# Patient Record
Sex: Female | Born: 1965 | Race: Black or African American | Hispanic: No | Smoking: Never smoker
Health system: Southern US, Community
[De-identification: ages and names within clinical notes are randomized; demographics above are authoritative.]

## PROBLEM LIST (undated history)

## (undated) DIAGNOSIS — G43909 Migraine, unspecified, not intractable, without status migrainosus: Secondary | ICD-10-CM

## (undated) DIAGNOSIS — C9590 Leukemia, unspecified not having achieved remission: Secondary | ICD-10-CM

## (undated) HISTORY — PX: ABDOMINAL HYSTERECTOMY: SHX81

---

## 2006-01-02 ENCOUNTER — Emergency Department (HOSPITAL_COMMUNITY): Admission: EM | Admit: 2006-01-02 | Discharge: 2006-01-02 | Payer: Self-pay | Admitting: Emergency Medicine

## 2007-04-03 ENCOUNTER — Emergency Department (HOSPITAL_COMMUNITY): Admission: EM | Admit: 2007-04-03 | Discharge: 2007-04-03 | Payer: Self-pay | Admitting: Emergency Medicine

## 2008-02-04 ENCOUNTER — Emergency Department (HOSPITAL_COMMUNITY): Admission: EM | Admit: 2008-02-04 | Discharge: 2008-02-04 | Payer: Self-pay | Admitting: Family Medicine

## 2008-04-20 ENCOUNTER — Emergency Department (HOSPITAL_COMMUNITY): Admission: EM | Admit: 2008-04-20 | Discharge: 2008-04-20 | Payer: Self-pay | Admitting: Emergency Medicine

## 2010-01-09 IMAGING — CR DG SHOULDER 2+V*R*
4 series · 4 of 4 positions shown · non-contrast
Comparison: None

CLINICAL DATA: Shoulder pain, no acute injury

RIGHT SHOULDER - 2+ VIEW

[view not recorded (1 of 4)]
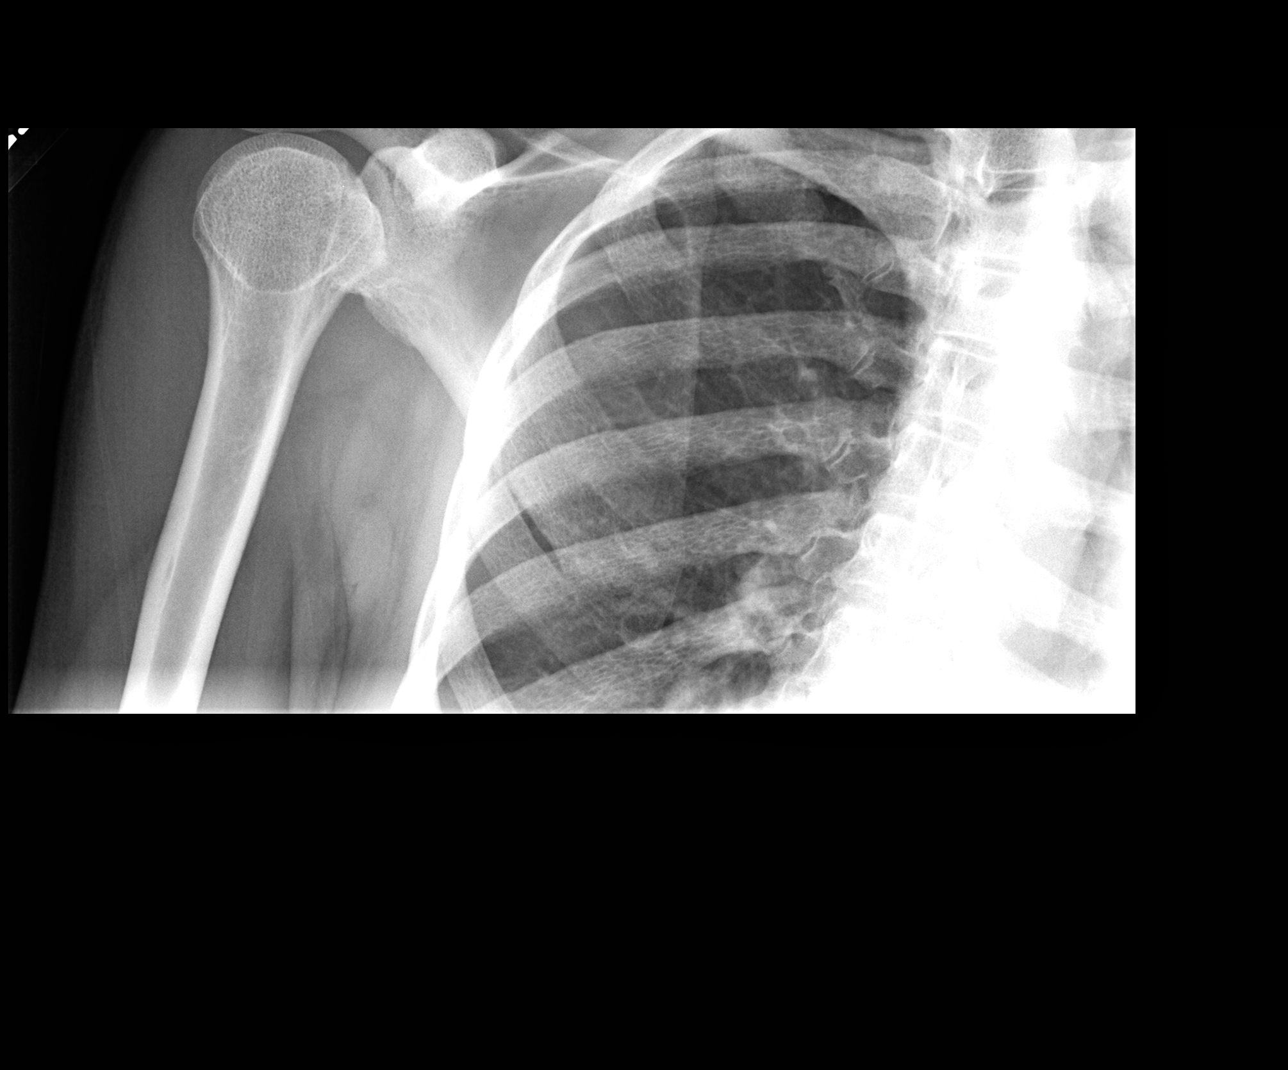

[view not recorded (2 of 4)]
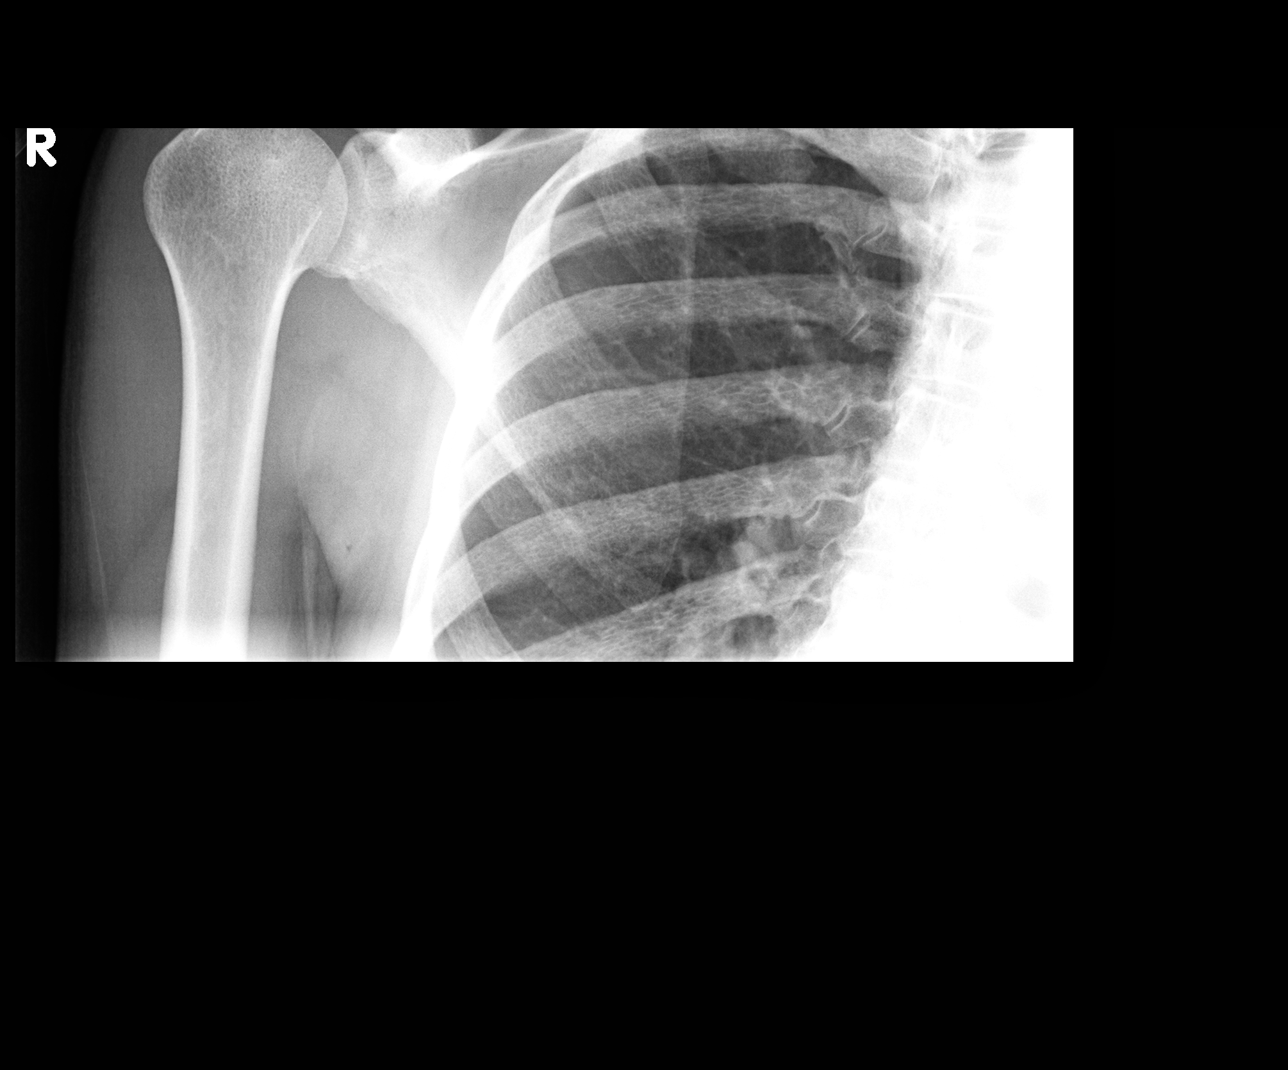

[view not recorded (3 of 4)]
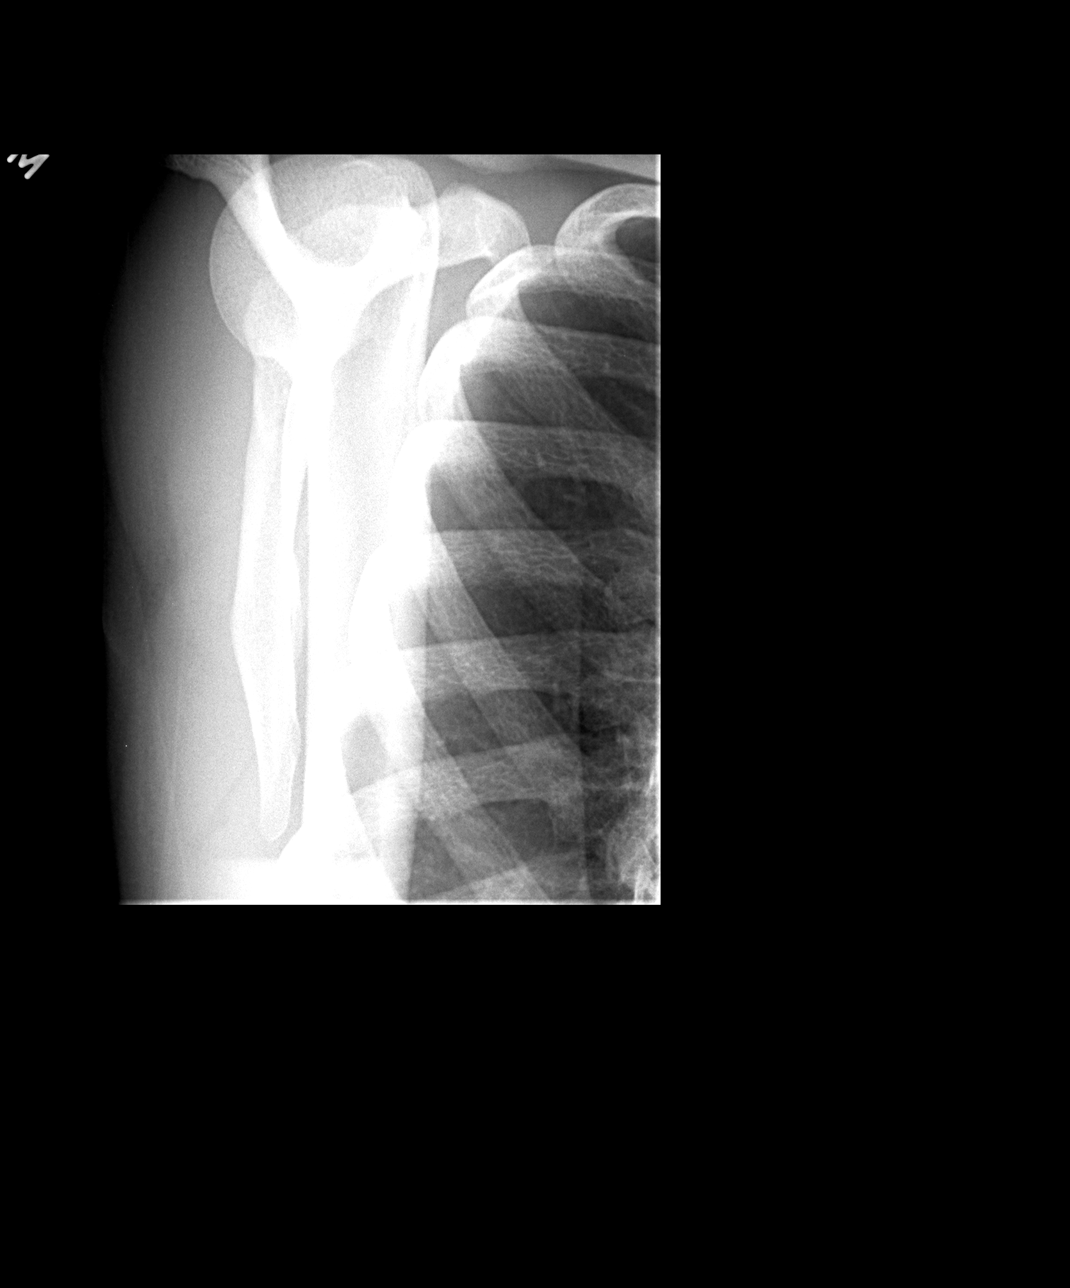

[view not recorded (4 of 4)]
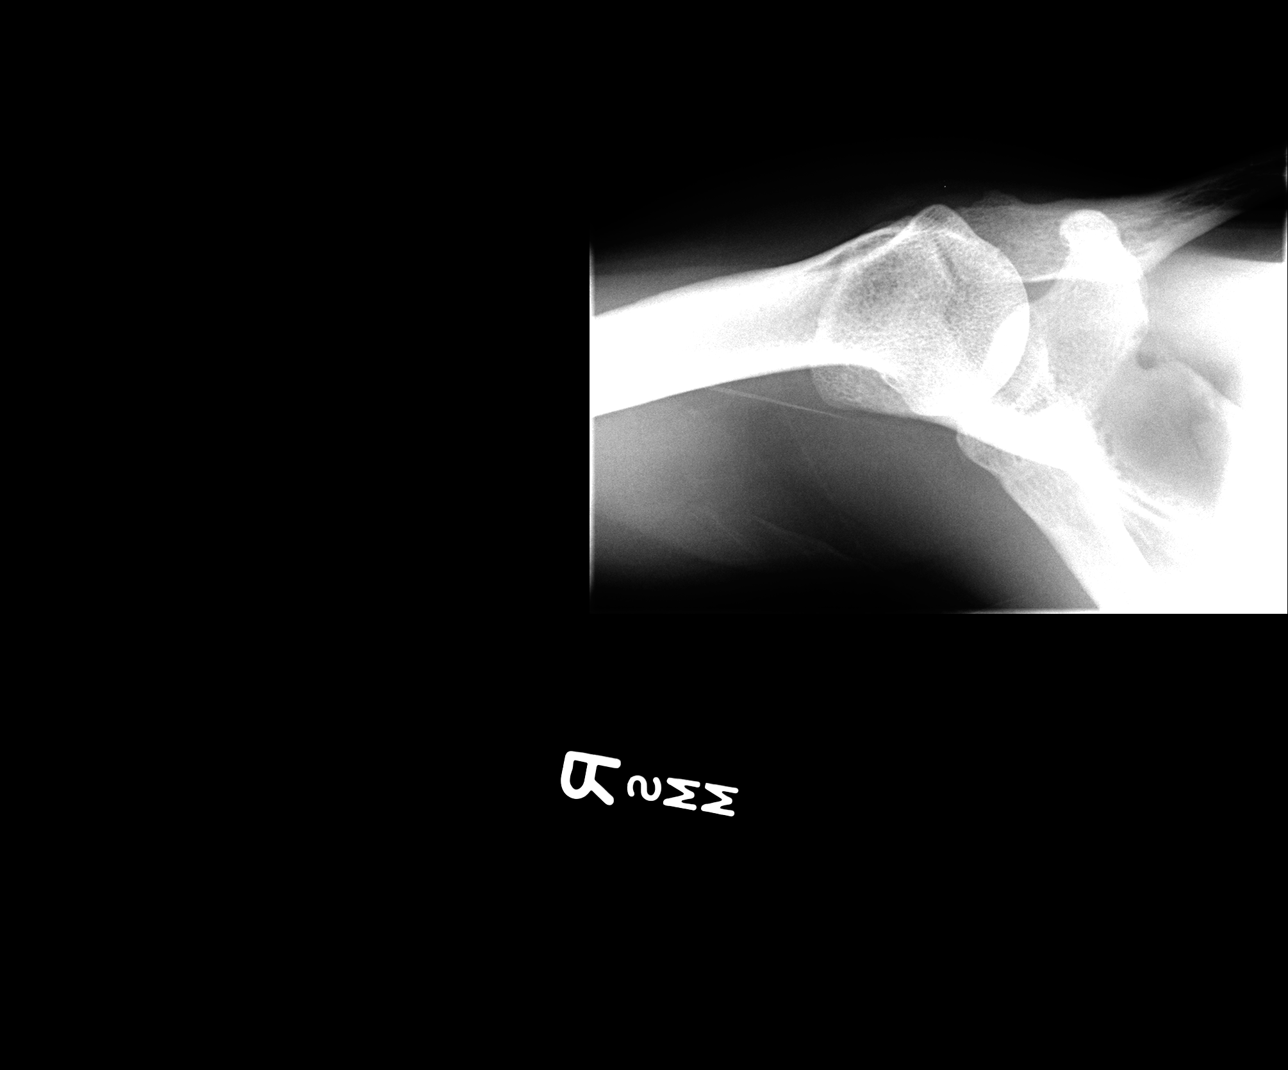

[4 of 4 positions shown; findings below may reference images not displayed]

FINDINGS: The glenohumeral joint space appears normal.  The AC
joint is normally aligned.  No acute bony abnormality is seen.
IMPRESSION: Negative right shoulder.

## 2010-03-26 IMAGING — CR DG ABDOMEN 1V
1 series · 1 of 1 positions shown · non-contrast
Comparison: None

CLINICAL DATA: Hematuria

ABDOMEN - 1 VIEW

[t abdomen supine]
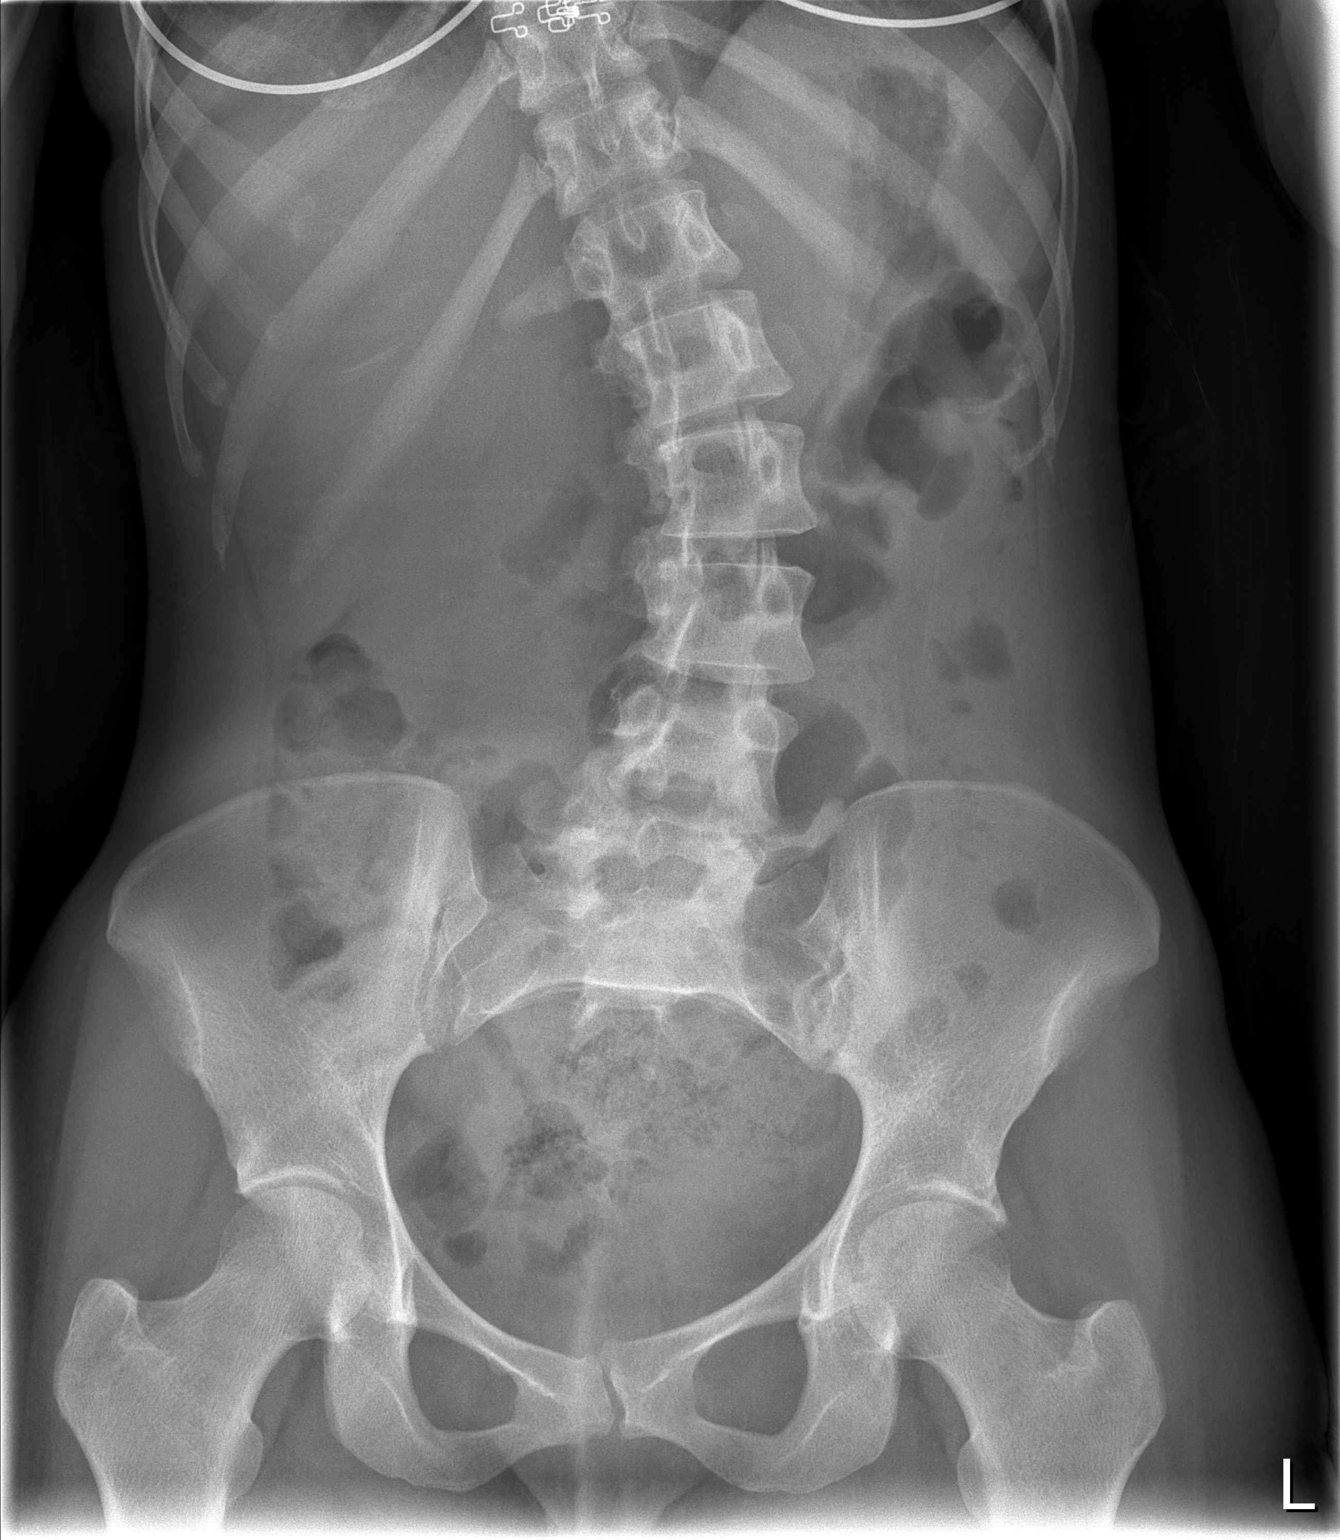

[1 of 1 positions shown; findings below may reference images not displayed]

FINDINGS: The bowel gas pattern is unremarkable.  There is a
significant left convex rotatory scoliotic curvature lumbar spine.
The soft tissue shadows of the abdomen are maintained.  No
worrisome calcifications are seen.
IMPRESSION: No plain film evidence of acute abdominal process.

## 2010-06-08 LAB — DIFFERENTIAL
Basophils Absolute: 0 10*3/uL (ref 0.0–0.1)
Basophils Relative: 0 % (ref 0–1)
Neutro Abs: 12.9 10*3/uL — ABNORMAL HIGH (ref 1.7–7.7)
Neutrophils Relative %: 84 % — ABNORMAL HIGH (ref 43–77)

## 2010-06-08 LAB — URINALYSIS, ROUTINE W REFLEX MICROSCOPIC
Glucose, UA: NEGATIVE mg/dL
Ketones, ur: 40 mg/dL — AB
pH: 6 (ref 5.0–8.0)

## 2010-06-08 LAB — URINE MICROSCOPIC-ADD ON

## 2010-06-08 LAB — POCT I-STAT, CHEM 8
Chloride: 101 mEq/L (ref 96–112)
HCT: 38 % (ref 36.0–46.0)
Hemoglobin: 12.9 g/dL (ref 12.0–15.0)
Potassium: 3.2 mEq/L — ABNORMAL LOW (ref 3.5–5.1)

## 2010-06-08 LAB — CBC
MCHC: 32.9 g/dL (ref 30.0–36.0)
Platelets: 268 10*3/uL (ref 150–400)
RBC: 4.21 MIL/uL (ref 3.87–5.11)
RDW: 15.2 % (ref 11.5–15.5)

## 2012-11-02 ENCOUNTER — Emergency Department (HOSPITAL_COMMUNITY)
Admission: EM | Admit: 2012-11-02 | Discharge: 2012-11-02 | Discharge status: Against Medical Advice | Payer: Self-pay | Attending: Emergency Medicine | Admitting: Emergency Medicine

## 2012-11-02 ENCOUNTER — Encounter (HOSPITAL_COMMUNITY): Payer: Self-pay | Admitting: Emergency Medicine

## 2012-11-02 DIAGNOSIS — G43909 Migraine, unspecified, not intractable, without status migrainosus: Secondary | ICD-10-CM | POA: Insufficient documentation

## 2012-11-02 HISTORY — DX: Migraine, unspecified, not intractable, without status migrainosus: G43.909

## 2012-11-02 NOTE — ED Notes (Signed)
Patient with headache since this morning.  She does have nausea and vomiting.  Patient is photophobic.  Patient took one goodie powder this morning with no relief.

## 2015-01-09 ENCOUNTER — Emergency Department (HOSPITAL_COMMUNITY)
Admission: EM | Admit: 2015-01-09 | Discharge: 2015-01-09 | Disposition: A | Discharge status: Home / Self Care | Payer: Self-pay | Attending: Emergency Medicine | Admitting: Emergency Medicine

## 2015-01-09 ENCOUNTER — Encounter (HOSPITAL_COMMUNITY): Payer: Self-pay | Admitting: *Deleted

## 2015-01-09 DIAGNOSIS — Z8679 Personal history of other diseases of the circulatory system: Secondary | ICD-10-CM | POA: Insufficient documentation

## 2015-01-09 DIAGNOSIS — R519 Headache, unspecified: Secondary | ICD-10-CM

## 2015-01-09 DIAGNOSIS — Z7982 Long term (current) use of aspirin: Secondary | ICD-10-CM | POA: Insufficient documentation

## 2015-01-09 DIAGNOSIS — R51 Headache: Secondary | ICD-10-CM | POA: Insufficient documentation

## 2015-01-09 DIAGNOSIS — H53149 Visual discomfort, unspecified: Secondary | ICD-10-CM | POA: Insufficient documentation

## 2015-01-09 MED ORDER — KETOROLAC TROMETHAMINE 30 MG/ML IJ SOLN
30.0000 mg | Freq: Once | INTRAMUSCULAR | Status: AC
  Administered 2015-01-09: 30 mg via INTRAMUSCULAR
  Filled 2015-01-09: qty 1

## 2015-01-09 MED ORDER — DIPHENHYDRAMINE HCL 50 MG/ML IJ SOLN
25.0000 mg | Freq: Once | INTRAMUSCULAR | Status: AC
  Administered 2015-01-09: 25 mg via INTRAMUSCULAR
  Filled 2015-01-09: qty 1

## 2015-01-09 MED ORDER — METOCLOPRAMIDE HCL 5 MG/ML IJ SOLN
10.0000 mg | Freq: Once | INTRAMUSCULAR | Status: AC
Start: 2015-01-09 — End: 2015-01-09
  Administered 2015-01-09: 10 mg via INTRAMUSCULAR
  Filled 2015-01-09: qty 2

## 2015-01-09 NOTE — ED Notes (Signed)
Pt with hx of migraines to ED c/o frontal migraine since 8 am.  Pt photophobic but denies nausea. Pt states no relief with exedrine.

## 2015-01-09 NOTE — ED Provider Notes (Signed)
CSN: [id number]     Arrival date & time 01/09/15  1850 History  By signing my name below, I, Randa Evens, attest that this documentation has been prepared under the direction and in the presence of Glendell Docker, NP. Electronically Signed: Randa Evens, ED Scribe. 01/09/2015. 7:55 PM.      Chief Complaint  Patient presents with  . Migraine    Patient is a 49 y.o. female presenting with migraines. The history is provided by the patient. No language interpreter was used.  Migraine Associated symptoms include headaches.   HPI Comments: Morgan Waters is a 49 y.o. female with PMHx of migraines who presents to the Emergency Department complaining of frontal migraine HA onset 1 night prior. Pt reports associated photophobia. Pt states that she has tried Excedrin with no relief. Pt states that the her symptoms are worse with bright lights.  Pt denies fever, nausea vomiting, changes in vision, numbness or weakness. Similar to previous headache.  Past Medical History  Diagnosis Date  . Migraine    Past Surgical History  Procedure Laterality Date  . Cesarean section    . Abdominal hysterectomy     No family history on file. Social History  Substance Use Topics  . Smoking status: Never Smoker   . Smokeless tobacco: None  . Alcohol Use: Yes     Comment: occasionally   OB History    No data available     Review of Systems  Constitutional: Negative for fever.  Eyes: Positive for photophobia. Negative for visual disturbance.  Gastrointestinal: Negative for nausea and vomiting.  Neurological: Positive for headaches. Negative for weakness and numbness.  All other systems reviewed and are negative.     Allergies  Review of patient's allergies indicates no known allergies.  Home Medications   Prior to Admission medications   Medication Sig Start Date End Date Taking? Authorizing Provider  Aspirin-Salicylamide-Caffeine (BC HEADACHE POWDER PO) Take 1 packet by mouth  daily as needed. For pain/migraines    Historical Provider, MD   BP 133/95 mmHg  Pulse 68  Temp(Src) 97.8 F (36.6 C) (Oral)  Resp 14  SpO2 100%   Physical Exam  Constitutional: She is oriented to person, place, and time. She appears well-developed and well-nourished. No distress.  HENT:  Head: Normocephalic and atraumatic.  Right Ear: External ear normal.  Left Ear: External ear normal.  Eyes: Conjunctivae and EOM are normal. Pupils are equal, round, and reactive to light.  Neck: Neck supple. No tracheal deviation present.  Cardiovascular: Normal rate.   Pulmonary/Chest: Effort normal. No respiratory distress.  Musculoskeletal: Normal range of motion.  Neurological: She is alert and oriented to person, place, and time. She exhibits normal muscle tone. Coordination normal.  Skin: Skin is warm and dry.  Psychiatric: She has a normal mood and affect. Her behavior is normal.  Nursing note and vitals reviewed.   ED Course  Procedures (including critical care time) DIAGNOSTIC STUDIES: Oxygen Saturation is 100% on RA, normal by my interpretation.    COORDINATION OF CARE: 7:55 PM-Discussed treatment plan with pt at bedside and pt agreed to plan.     Labs Review Labs Reviewed - No data to display  Imaging Review No results found.    EKG Interpretation None      MDM   Final diagnoses:  Headache, unspecified headache type   Pt is okay to go home as she is feeling better. Pt is neurologically intact. No red flag symptoms  I personally performed  the services described in this documentation, which was scribed in my presence. The recorded information has been reviewed and is accurate.      Glendell Docker, NP 01/09/15 [id number]  Quintella Reichert, MD 01/10/15 [phone]

## 2015-01-09 NOTE — Discharge Instructions (Signed)

## 2015-02-20 ENCOUNTER — Emergency Department (HOSPITAL_COMMUNITY)
Admission: EM | Admit: 2015-02-20 | Discharge: 2015-02-20 | Disposition: A | Discharge status: Home / Self Care | Payer: Self-pay | Attending: Emergency Medicine | Admitting: Emergency Medicine

## 2015-02-20 ENCOUNTER — Encounter (HOSPITAL_COMMUNITY): Payer: Self-pay | Admitting: Emergency Medicine

## 2015-02-20 DIAGNOSIS — G43909 Migraine, unspecified, not intractable, without status migrainosus: Secondary | ICD-10-CM | POA: Insufficient documentation

## 2015-02-20 DIAGNOSIS — J069 Acute upper respiratory infection, unspecified: Secondary | ICD-10-CM

## 2015-02-20 DIAGNOSIS — Z7982 Long term (current) use of aspirin: Secondary | ICD-10-CM | POA: Insufficient documentation

## 2015-02-20 MED ORDER — PROMETHAZINE-DM 6.25-15 MG/5ML PO SYRP: 5.0000 mL | ORAL_SOLUTION | Freq: Four times a day (QID) | ORAL | Status: DC | PRN

## 2015-02-20 MED ORDER — GUAIFENESIN 100 MG/5ML PO LIQD: 100.0000 mg | ORAL | Status: DC | PRN

## 2015-02-20 NOTE — ED Notes (Signed)
Please see provider note for assessment.

## 2015-02-20 NOTE — Discharge Instructions (Signed)
Upper Respiratory Infection, Adult Most upper respiratory infections (URIs) are a viral infection of the air passages leading to the lungs. A URI affects the nose, throat, and upper air passages. The most common type of URI is nasopharyngitis and is typically referred to as "the common cold." URIs run their course and usually go away on their own. Most of the time, a URI does not require medical attention, but sometimes a bacterial infection in the upper airways can follow a viral infection. This is called a secondary infection. Sinus and middle ear infections are common types of secondary upper respiratory infections. Bacterial pneumonia can also complicate a URI. A URI can worsen asthma and chronic obstructive pulmonary disease (COPD). Sometimes, these complications can require emergency medical care and may be life threatening.  CAUSES Almost all URIs are caused by viruses. A virus is a type of germ and can spread from one person to another.  RISKS FACTORS You may be at risk for a URI if:   You smoke.   You have chronic heart or lung disease.  You have a weakened defense (immune) system.   You are very young or very old.   You have nasal allergies or asthma.  You work in crowded or poorly ventilated areas.  You work in health care facilities or schools. SIGNS AND SYMPTOMS  Symptoms typically develop 2-3 days after you come in contact with a cold virus. Most viral URIs last 7-10 days. However, viral URIs from the influenza virus (flu virus) can last 14-18 days and are typically more severe. Symptoms may include:   Runny or stuffy (congested) nose.   Sneezing.   Cough.   Sore throat.   Headache.   Fatigue.   Fever.   Loss of appetite.   Pain in your forehead, behind your eyes, and over your cheekbones (sinus pain).  Muscle aches.  DIAGNOSIS  Your health care provider may diagnose a URI by:  Physical exam.  Tests to check that your symptoms are not due to  another condition such as:  Strep throat.  Sinusitis.  Pneumonia.  Asthma. TREATMENT  A URI goes away on its own with time. It cannot be cured with medicines, but medicines may be prescribed or recommended to relieve symptoms. Medicines may help:  Reduce your fever.  Reduce your cough.  Relieve nasal congestion. HOME CARE INSTRUCTIONS   Take medicines only as directed by your health care provider.   Gargle warm saltwater or take cough drops to comfort your throat as directed by your health care provider.  Use a warm mist humidifier or inhale steam from a shower to increase air moisture. This may make it easier to breathe.  Drink enough fluid to keep your urine clear or pale yellow.   Eat soups and other clear broths and maintain good nutrition.   Rest as needed.   Return to work when your temperature has returned to normal or as your health care provider advises. You may need to stay home longer to avoid infecting others. You can also use a face mask and careful hand washing to prevent spread of the virus.  Increase the usage of your inhaler if you have asthma.   Do not use any tobacco products, including cigarettes, chewing tobacco, or electronic cigarettes. If you need help quitting, ask your health care provider. PREVENTION  The best way to protect yourself from getting a cold is to practice good hygiene.   Avoid oral or hand contact with people with cold   symptoms.   Wash your hands often if contact occurs.  There is no clear evidence that vitamin C, vitamin E, echinacea, or exercise reduces the chance of developing a cold. However, it is always recommended to get plenty of rest, exercise, and practice good nutrition.  SEEK MEDICAL CARE IF:   You are getting worse rather than better.   Your symptoms are not controlled by medicine.   You have chills.  You have worsening shortness of breath.  You have brown or red mucus.  You have yellow or brown nasal  discharge.  You have pain in your face, especially when you bend forward.  You have a fever.  You have swollen neck glands.  You have pain while swallowing.  You have white areas in the back of your throat. SEEK IMMEDIATE MEDICAL CARE IF:   You have severe or persistent:  Headache.  Ear pain.  Sinus pain.  Chest pain.  You have chronic lung disease and any of the following:  Wheezing.  Prolonged cough.  Coughing up blood.  A change in your usual mucus.  You have a stiff neck.  You have changes in your:  Vision.  Hearing.  Thinking.  Mood. MAKE SURE YOU:   Understand these instructions.  Will watch your condition.  Will get help right away if you are not doing well or get worse.   This information is not intended to replace advice given to you by your health care provider. Make sure you discuss any questions you have with your health care provider.   Document Released: 08/03/2000 Document Revised: 06/24/2014 Document Reviewed: 05/15/2013 Elsevier Interactive Patient Education 2016 [employer]  

## 2015-02-20 NOTE — ED Provider Notes (Signed)
CSN: [id number]   Arrival date & time 02/20/15 1719  History  By signing my name below, I, [name], attest that this documentation has been prepared under the direction and in the presence of  Domenic Moras PA-C Electronically Signed: [name], ED Scribe. 02/20/2015. 5:46 PM. Chief Complaint  Patient presents with  . Cough  . Nasal Congestion    HPI The history is provided by the patient. No language interpreter was used.   Morgan Waters is a 49 y.o. female who presents to the Emergency Department complaining of a  cough productive of yellow sputum with onset 4 days ago. There has been no recent change in the severity of the cough.  Associated symptoms include right-sided headache, congestion, sore throat since this morning, and SOB at night. She has been treating her symptoms with Tylenol and increased liquid intake. Pt denies sneezing, runny nose, fever, chills, vomiting, diarrhea, and rash. She has been exposed to people with similar symptoms. Pt denies tobacco use.   Past Medical History  Diagnosis Date  . Migraine     Past Surgical History  Procedure Laterality Date  . Cesarean section    . Abdominal hysterectomy      No family history on file.  Social History  Substance Use Topics  . Smoking status: Never Smoker   . Smokeless tobacco: None  . Alcohol Use: Yes     Comment: occasionally     Review of Systems  Constitutional: Negative for fever and chills.  HENT: Positive for congestion and sore throat. Negative for rhinorrhea and sneezing.   Respiratory: Positive for cough and shortness of breath.   Gastrointestinal: Negative for nausea, vomiting and diarrhea.  Skin: Negative for rash.  Neurological: Positive for headaches.   Home Medications   Prior to Admission medications   Medication Sig Start Date End Date Taking? Authorizing Provider  Aspirin-Salicylamide-Caffeine (BC HEADACHE POWDER PO) Take 1 packet by mouth daily as needed. For  pain/migraines    Historical Provider, MD    Allergies  Review of patient's allergies indicates no known allergies.  Triage Vitals: BP 107/76 mmHg  Pulse 69  Temp(Src) 98.2 F (36.8 C) (Oral)  Resp 19  SpO2 100%  Physical Exam  Constitutional: She is oriented to person, place, and time. She appears well-developed and well-nourished. No distress.  HENT:  Head: Normocephalic and atraumatic.  Right Ear: Tympanic membrane normal.  Left Ear: Tympanic membrane normal.  Nose: Rhinorrhea (mild) present.  Mouth/Throat: Uvula is midline. No trismus in the jaw. No oropharyngeal exudate or posterior oropharyngeal erythema.  Eyes: Conjunctivae and EOM are normal.  Neck: Neck supple. No tracheal deviation present.  Cardiovascular: Normal rate.   Pulmonary/Chest: Effort normal. No respiratory distress.  CTAB  Musculoskeletal: Normal range of motion.  Lymphadenopathy:    She has no cervical adenopathy.  Neurological: She is alert and oriented to person, place, and time.  Skin: Skin is warm and dry.  Psychiatric: She has a normal mood and affect. Her behavior is normal.  Nursing note and vitals reviewed.   ED Course  Procedures  DIAGNOSTIC STUDIES: Oxygen Saturation is 100% on RA,  normal by my interpretation.    COORDINATION OF CARE: 5:44 PM Discussed treatment plan which includes discharge with symptomatic treatment with pt at bedside and pt agreed to the plan.    MDM   Patients symptoms are consistent with URI, likely viral etiology. Discussed that antibiotics are not indicated for viral infections. Pt will be discharged with symptomatic  treatment.  Verbalizes understanding and is agreeable with plan. Pt is hemodynamically stable & in NAD prior to dc.  Final diagnoses:  URI (upper respiratory infection)    BP 107/76 mmHg  Pulse 69  Temp(Src) 98.2 F (36.8 C) (Oral)  Resp 19  SpO2 100%  I personally performed the services described in this documentation, which was scribed  in my presence. The recorded information has been reviewed and is accurate.        Domenic Moras, PA-C 02/20/15 1754  Daleen Bo, MD 02/21/15 [phone]

## 2015-02-20 NOTE — ED Notes (Signed)
Pt from home for eval of cough x4 days with yellow mucus production. Pt denies any cp/sob at this time. Pt states congestion gets worse at night with cough and pt starts to get a headache, pt denies any headache at this time. nad noted.

## 2015-03-26 ENCOUNTER — Encounter (HOSPITAL_COMMUNITY): Payer: Self-pay | Admitting: Emergency Medicine

## 2015-03-26 ENCOUNTER — Emergency Department (HOSPITAL_COMMUNITY)
Admission: EM | Admit: 2015-03-26 | Discharge: 2015-03-26 | Disposition: A | Discharge status: Home / Self Care | Payer: Self-pay | Attending: Emergency Medicine | Admitting: Emergency Medicine

## 2015-03-26 DIAGNOSIS — S39012A Strain of muscle, fascia and tendon of lower back, initial encounter: Secondary | ICD-10-CM | POA: Insufficient documentation

## 2015-03-26 DIAGNOSIS — Y990 Civilian activity done for income or pay: Secondary | ICD-10-CM | POA: Insufficient documentation

## 2015-03-26 DIAGNOSIS — Y9289 Other specified places as the place of occurrence of the external cause: Secondary | ICD-10-CM | POA: Insufficient documentation

## 2015-03-26 DIAGNOSIS — Y9389 Activity, other specified: Secondary | ICD-10-CM | POA: Insufficient documentation

## 2015-03-26 DIAGNOSIS — G43909 Migraine, unspecified, not intractable, without status migrainosus: Secondary | ICD-10-CM | POA: Insufficient documentation

## 2015-03-26 DIAGNOSIS — M545 Low back pain, unspecified: Secondary | ICD-10-CM

## 2015-03-26 DIAGNOSIS — X500XXA Overexertion from strenuous movement or load, initial encounter: Secondary | ICD-10-CM | POA: Insufficient documentation

## 2015-03-26 MED ORDER — IBUPROFEN 800 MG PO TABS: 800.0000 mg | ORAL_TABLET | Freq: Three times a day (TID) | ORAL | Status: DC

## 2015-03-26 MED ORDER — CYCLOBENZAPRINE HCL 5 MG PO TABS: 5.0000 mg | ORAL_TABLET | Freq: Three times a day (TID) | ORAL | Status: DC | PRN

## 2015-03-26 NOTE — Discharge Instructions (Signed)

## 2015-03-26 NOTE — ED Provider Notes (Signed)
CSN: [id number]     Arrival date & time 03/26/15  1259 History  By signing my name below, I, Essence Howell, attest that this documentation has been prepared under the direction and in the presence of Gloriann Loan, PA-C Electronically Signed: Ladene Artist, ED Scribe 03/26/2015 at 1:17 PM.   Chief Complaint  Patient presents with  . Back Pain   The history is provided by the patient. No language interpreter was used.   HPI Comments: Morgan Waters is a 50 y.o. female who presents to the Emergency Department complaining of constant, non-radiating low back pain onset last night. Pt reports a lot of heavy lifting and pulling last night at work. Pain is worsened with prolonged sitting and palpation. She has tried [employer] without significant relief. Pt denies bladder/bowel incontience, fever, chills, abdominal pain, dysuria, numbness or weakness in lower extremities. No h/o IV drug use or CA.   Past Medical History  Diagnosis Date  . Migraine    Past Surgical History  Procedure Laterality Date  . Cesarean section    . Abdominal hysterectomy     No family history on file. Social History  Substance Use Topics  . Smoking status: Never Smoker   . Smokeless tobacco: None  . Alcohol Use: Yes     Comment: occasionally   OB History    No data available     Review of Systems  Constitutional: Negative for fever and chills.  Gastrointestinal: Negative for abdominal pain.  Genitourinary: Negative for dysuria.  Musculoskeletal: Positive for back pain.  Neurological: Negative for weakness and numbness.  All other systems reviewed and are negative.  Allergies  Review of patient's allergies indicates no known allergies.  Home Medications   Prior to Admission medications   Medication Sig Start Date End Date Taking? Authorizing Provider  Aspirin-Salicylamide-Caffeine (BC HEADACHE POWDER PO) Take 1 packet by mouth daily as needed. For pain/migraines    Historical Provider, MD   cyclobenzaprine (FLEXERIL) 5 MG tablet Take 1 tablet (5 mg total) by mouth 3 (three) times daily as needed for muscle spasms. 03/26/15   Gloriann Loan, PA-C  guaiFENesin (ROBITUSSIN) 100 MG/5ML liquid Take 5-10 mLs (100-200 mg total) by mouth every 4 (four) hours as needed for congestion. 02/20/15   Domenic Moras, PA-C  ibuprofen (ADVIL,MOTRIN) 800 MG tablet Take 1 tablet (800 mg total) by mouth 3 (three) times daily. 03/26/15   Gloriann Loan, PA-C  promethazine-dextromethorphan (PROMETHAZINE-DM) 6.25-15 MG/5ML syrup Take 5 mLs by mouth 4 (four) times daily as needed for cough. 02/20/15   Domenic Moras, PA-C   BP 118/70 mmHg  Pulse 88  Temp(Src) 98.6 F (37 C) (Oral)  Resp 20  Ht 5' (1.524 m)  Wt 118 lb (53.524 kg)  BMI 23.05 kg/m2  SpO2 100% Physical Exam  Constitutional: She is oriented to person, place, and time. She appears well-developed and well-nourished.  HENT:  Head: Atraumatic.  Eyes: Conjunctivae are normal.  Cardiovascular: Normal rate, regular rhythm, normal heart sounds and intact distal pulses.   Pulses:      Dorsalis pedis pulses are 2+ on the right side, and 2+ on the left side.  Pulmonary/Chest: Effort normal and breath sounds normal.  Abdominal: Soft. Bowel sounds are normal. She exhibits no distension. There is no tenderness.  Musculoskeletal: Normal range of motion. She exhibits tenderness.       Lumbar back: She exhibits tenderness and pain. She exhibits no bony tenderness, no swelling, no edema, no deformity and no laceration.  Back:  No spinous process tenderness.  No step offs. No crepitus.  Neurological: She is alert and oriented to person, place, and time.  No saddle anesthesia. Strength and sensation intact bilaterally throughout lower extremities.  Skin: Skin is warm and dry.  Psychiatric: She has a normal mood and affect. Her behavior is normal.   ED Course  Procedures (including critical care time) DIAGNOSTIC STUDIES: Oxygen Saturation is 100% on RA, normal  by my interpretation.    COORDINATION OF CARE: 1:09 PM-Discussed treatment plan which includes ibuprofen and Flexeril with pt at bedside and pt agreed to plan.   Labs Review Labs Reviewed - No data to display  Imaging Review No results found.   EKG Interpretation None      MDM   Final diagnoses:  Bilateral low back pain without sciatica  Lumbosacral strain, initial encounter    Patient with back pain.  No neurological deficits and normal neuro exam.  Patient can walk but states is painful. No loss of bowel or bladder control.  No concern for cauda equina.  No fever, night sweats, weight loss, h/o cancer, IVDU.  RICE protocol and pain medicine indicated and discussed with patient.   I personally performed the services described in this documentation, which was scribed in my presence. The recorded information has been reviewed and is accurate.    Gloriann Loan, PA-C 03/26/15 Doctor Phillips, MD 03/26/15 1540

## 2015-03-26 NOTE — ED Notes (Signed)
Patient states was at work last night and hurt her back.  Patient complains of low back pain.   No radiation.    Patient states "I was pulling sheets".

## 2015-09-25 ENCOUNTER — Encounter (HOSPITAL_COMMUNITY): Payer: Self-pay | Admitting: *Deleted

## 2015-09-25 ENCOUNTER — Emergency Department (HOSPITAL_COMMUNITY)
Admission: EM | Admit: 2015-09-25 | Discharge: 2015-09-26 | Disposition: A | Discharge status: Home / Self Care | Payer: Self-pay | Attending: Emergency Medicine | Admitting: Emergency Medicine

## 2015-09-25 DIAGNOSIS — Z7982 Long term (current) use of aspirin: Secondary | ICD-10-CM | POA: Insufficient documentation

## 2015-09-25 DIAGNOSIS — G43109 Migraine with aura, not intractable, without status migrainosus: Secondary | ICD-10-CM | POA: Insufficient documentation

## 2015-09-25 DIAGNOSIS — N390 Urinary tract infection, site not specified: Secondary | ICD-10-CM | POA: Insufficient documentation

## 2015-09-25 LAB — CBC
HCT: 31.5 % — ABNORMAL LOW (ref 36.0–46.0)
Hemoglobin: 10.5 g/dL — ABNORMAL LOW (ref 12.0–15.0)
MCH: 29.7 pg (ref 26.0–34.0)
MCHC: 33.3 g/dL (ref 30.0–36.0)
MCV: 89.2 fL (ref 78.0–100.0)
Platelets: 182 10*3/uL (ref 150–400)
RBC: 3.53 MIL/uL — ABNORMAL LOW (ref 3.87–5.11)
RDW: 14.4 % (ref 11.5–15.5)
WBC: 5.6 10*3/uL (ref 4.0–10.5)

## 2015-09-25 LAB — COMPREHENSIVE METABOLIC PANEL
ALK PHOS: 80 U/L (ref 38–126)
ALT: 14 U/L (ref 14–54)
ANION GAP: 8 (ref 5–15)
AST: 21 U/L (ref 15–41)
Albumin: 3.7 g/dL (ref 3.5–5.0)
BUN: 10 mg/dL (ref 6–20)
CALCIUM: 9.1 mg/dL (ref 8.9–10.3)
CO2: 24 mmol/L (ref 22–32)
Chloride: 104 mmol/L (ref 101–111)
Creatinine, Ser: 0.91 mg/dL (ref 0.44–1.00)
GFR calc Af Amer: >60 mL/min (ref >60)
GFR calc non Af Amer: >60 mL/min (ref >60)
GLUCOSE: 151 mg/dL — AB (ref 65–99)
POTASSIUM: 3.3 mmol/L — AB (ref 3.5–5.1)
SODIUM: 136 mmol/L (ref 135–145)
TOTAL PROTEIN: 7.3 g/dL (ref 6.5–8.1)
Total Bilirubin: 0.4 mg/dL (ref 0.3–1.2)

## 2015-09-25 LAB — LIPASE, BLOOD: LIPASE: 11 U/L (ref 11–51)

## 2015-09-25 MED ORDER — DIPHENHYDRAMINE HCL 50 MG/ML IJ SOLN
25.0000 mg | Freq: Once | INTRAMUSCULAR | Status: AC
  Administered 2015-09-26: 25 mg via INTRAVENOUS
  Filled 2015-09-25: qty 1

## 2015-09-25 MED ORDER — SODIUM CHLORIDE 0.9 % IV BOLUS (SEPSIS)
1000.0000 mL | Freq: Once | INTRAVENOUS | Status: AC
  Administered 2015-09-26: 1000 mL via INTRAVENOUS

## 2015-09-25 MED ORDER — ONDANSETRON 4 MG PO TBDP
ORAL_TABLET | ORAL | Status: AC
  Filled 2015-09-25: qty 1

## 2015-09-25 MED ORDER — METOCLOPRAMIDE HCL 5 MG/ML IJ SOLN
10.0000 mg | Freq: Once | INTRAMUSCULAR | Status: AC
  Administered 2015-09-26: 10 mg via INTRAVENOUS
  Filled 2015-09-25: qty 2

## 2015-09-25 MED ORDER — OXYCODONE-ACETAMINOPHEN 5-325 MG PO TABS
ORAL_TABLET | ORAL | Status: AC
  Filled 2015-09-25: qty 1

## 2015-09-25 MED ORDER — OXYCODONE-ACETAMINOPHEN 5-325 MG PO TABS
1.0000 | ORAL_TABLET | ORAL | Status: DC | PRN
  Administered 2015-09-25: 1 via ORAL

## 2015-09-25 MED ORDER — KETOROLAC TROMETHAMINE 15 MG/ML IJ SOLN
15.0000 mg | Freq: Once | INTRAMUSCULAR | Status: AC
  Administered 2015-09-26: 15 mg via INTRAVENOUS
  Filled 2015-09-25: qty 1

## 2015-09-25 MED ORDER — ONDANSETRON 4 MG PO TBDP
4.0000 mg | ORAL_TABLET | Freq: Once | ORAL | Status: AC | PRN
  Administered 2015-09-25: 4 mg via ORAL

## 2015-09-25 NOTE — ED Provider Notes (Signed)
[facility] DEPT Provider Note   CSN: [id number] Arrival date & time: 09/25/15  2109  First Provider Contact:  First MD Initiated Contact with Patient 09/25/15 2338     By signing my name below, I, Morgan Waters, attest that this documentation has been prepared under the direction and in the presence of physician practitioner, Varney Biles, MD. Electronically Signed: Dora Waters, Scribe. 09/25/2015. 11:38 PM.   History   Chief Complaint Chief Complaint  Patient presents with  . Migraine  . Emesis  . Nausea    The history is provided by the patient. No language interpreter was used.    HPI Comments: Morgan Waters is a 50 y.o. female brought in by EMS, with PMHx of migraine, who presents to the Emergency Department complaining of sudden onset, constant, severe, sharp, frontal migraine for the last 4 hours. She notes associated photophobia as well as vomiting upon arrival to the ER tonight. Pt reports she has been experiencing severe headaches almost every day for years but has never seen a neurologist. She notes she was diagnosed with migraines years ago. Pt states her current migraine is non-radiating. She reports she usually takes Excedrin for her headaches with some relief, but took it tonight with no relief. She denies h/o heavy alcohol use, smoking, or substance abuse. She has no known allergies to medications. She states she does not have menstrual periods. Pt denies neuro deficits, numbness, blurry vision, sensitivity to sound, or any other associated symptoms.  Past Medical History:  Diagnosis Date  . Migraine     There are no active problems to display for this patient.   Past Surgical History:  Procedure Laterality Date  . ABDOMINAL HYSTERECTOMY    . CESAREAN SECTION      OB History    No data available       Home Medications    Prior to Admission medications   Medication Sig Start Date End Date Taking? Authorizing Provider    Aspirin-Salicylamide-Caffeine (BC HEADACHE POWDER PO) Take 1 packet by mouth daily as needed. For pain/migraines   Yes Historical Provider, MD  ibuprofen (ADVIL,MOTRIN) 600 MG tablet Take 1 tablet (600 mg total) by mouth every 6 (six) hours as needed. 09/26/15   Varney Biles, MD  ondansetron (ZOFRAN ODT) 4 MG disintegrating tablet Take 1 tablet (4 mg total) by mouth every 8 (eight) hours as needed for nausea or vomiting. 09/26/15   Varney Biles, MD  sulfamethoxazole-trimethoprim (BACTRIM DS,SEPTRA DS) 800-160 MG tablet Take 1 tablet by mouth 2 (two) times daily. 09/26/15 09/29/15  Varney Biles, MD    Family History History reviewed. No pertinent family history.  Social History Social History  Substance Use Topics  . Smoking status: Never Smoker  . Smokeless tobacco: Not on file  . Alcohol use Yes     Comment: occasionally     Allergies   Review of patient's allergies indicates no known allergies.   Review of Systems Review of Systems  A complete 10 system review of systems was obtained and all systems are negative except as noted in the HPI and PMH.   Physical Exam Updated Vital Signs BP 113/79   Pulse 65   Temp 98.2 F (36.8 C) (Oral)   Resp 12   Ht 5' (1.524 m)   Wt 127 lb (57.6 kg)   SpO2 100%   BMI 24.80 kg/m   Physical Exam  Constitutional: She is oriented to person, place, and time. She appears well-developed and well-nourished. No distress.  HENT:  Head: Normocephalic and atraumatic.  Eyes: Conjunctivae and EOM are normal.  EOM's intact. No nystagmus appreciated.  Neck: Neck supple. No tracheal deviation present.  Cardiovascular: Normal rate and regular rhythm.   Pulmonary/Chest: Effort normal. No respiratory distress.  Lungs are clear to auscultation.  Musculoskeletal: Normal range of motion.  Neurological: She is alert and oriented to person, place, and time.  Cranial nerves 2-12 intact. Upper extremity strength and sensory exam grossly normal.  Skin:  Skin is warm and dry.  Psychiatric: She has a normal mood and affect. Her behavior is normal.  Nursing note and vitals reviewed.   ED Treatments / Results  Labs (all labs ordered are listed, but only abnormal results are displayed) Labs Reviewed  COMPREHENSIVE METABOLIC PANEL - Abnormal; Notable for the following:       Result Value   Potassium 3.3 (*)    Glucose, Bld 151 (*)    All other components within normal limits  CBC - Abnormal; Notable for the following:    RBC 3.53 (*)    Hemoglobin 10.5 (*)    HCT 31.5 (*)    All other components within normal limits  URINALYSIS, ROUTINE W REFLEX MICROSCOPIC (NOT AT [facility]) - Abnormal; Notable for the following:    APPearance CLOUDY (*)    Ketones, ur 15 (*)    Leukocytes, UA MODERATE (*)    All other components within normal limits  URINE MICROSCOPIC-ADD ON - Abnormal; Notable for the following:    Squamous Epithelial / LPF 0-5 (*)    Bacteria, UA MANY (*)    All other components within normal limits  LIPASE, BLOOD    EKG  EKG Interpretation None       Radiology No results found.  Procedures Procedures (including critical care time)  DIAGNOSTIC STUDIES: Oxygen Saturation is 100% on RA, normal by my interpretation.    COORDINATION OF CARE: 11:38 PM Discussed treatment plan with pt at bedside and pt agreed to plan.   Medications Ordered in ED Medications  oxyCODONE-acetaminophen (PERCOCET/ROXICET) 5-325 MG per tablet 1 tablet (1 tablet Oral Given 09/25/15 2123)  ondansetron (ZOFRAN-ODT) 4 MG disintegrating tablet (not administered)  oxyCODONE-acetaminophen (PERCOCET/ROXICET) 5-325 MG per tablet (not administered)  sodium chloride 0.9 % bolus 1,000 mL (1,000 mLs Intravenous New Bag/Given 09/26/15 0115)  ondansetron (ZOFRAN-ODT) disintegrating tablet 4 mg (4 mg Oral Given 09/25/15 2123)  ketorolac (TORADOL) 15 MG/ML injection 15 mg (15 mg Intravenous Given 09/26/15 0117)  metoCLOPramide (REGLAN) injection 10 mg (10 mg  Intravenous Given 09/26/15 0117)  diphenhydrAMINE (BENADRYL) injection 25 mg (25 mg Intravenous Given 09/26/15 0116)     Initial Impression / Assessment and Plan / ED Course  I have reviewed the triage vital signs and the nursing notes.  Pertinent labs & imaging results that were available during my care of the patient were reviewed by me and considered in my medical decision making (see chart for details).  Clinical Course  Comment By Time  Upon reassessment, patient reports that the headache has resolved. She continued to have no neurologic complains. Strict return precautions discussed, pt will return to the ER if there is visual complains, seizures, altered mental status, loss of consciousness, dizziness, new focal weakness, or numbness. Her UA results discussed. Pt is having frequent urination - so we will tx her for UTI.  Varney Biles, MD 08/05 [phone]    I personally performed the services described in this documentation, which was scribed in my presence. The recorded information has  been reviewed and is accurate.   Pt comes in with cc of headaches. She was diagnosed with headaches years back and her headache are similar to her previous attacks. Headache are not well controlled it appears. Current headaches started 4 hours ago. No n/v/f/c. No precipitating factors. + photophobia. No focal neuro complains. Plan is to get iv fluid for headaches along with meds. Advised close f/u with a neurologist to get optimal control.   Final Clinical Impressions(s) / ED Diagnoses   Final diagnoses:  Migraine aura without headache  UTI (lower urinary tract infection)    New Prescriptions New Prescriptions   IBUPROFEN (ADVIL,MOTRIN) 600 MG TABLET    Take 1 tablet (600 mg total) by mouth every 6 (six) hours as needed.   ONDANSETRON (ZOFRAN ODT) 4 MG DISINTEGRATING TABLET    Take 1 tablet (4 mg total) by mouth every 8 (eight) hours as needed for nausea or vomiting.   SULFAMETHOXAZOLE-TRIMETHOPRIM  (BACTRIM DS,SEPTRA DS) 800-160 MG TABLET    Take 1 tablet by mouth 2 (two) times daily.     Varney Biles, MD 09/26/15 [phone]

## 2015-09-25 NOTE — ED Triage Notes (Signed)
Pt confirms EMS story. Pt actively vomiting in triage

## 2015-09-25 NOTE — ED Triage Notes (Signed)
Per EMS: pt coming from home with c/o migraine for four hours. States in all in the front of her head, denies vision changes, pupils equal and reactive. Pt denies CP, abdominal pain. Pt has hx of migraines, states usually treats with Excedrin.

## 2015-09-26 LAB — URINALYSIS, ROUTINE W REFLEX MICROSCOPIC
BILIRUBIN URINE: NEGATIVE
Glucose, UA: NEGATIVE mg/dL
Hgb urine dipstick: NEGATIVE
KETONES UR: 15 mg/dL — AB
NITRITE: NEGATIVE
PH: 7 (ref 5.0–8.0)
PROTEIN: NEGATIVE mg/dL
Specific Gravity, Urine: 1.019 (ref 1.005–1.030)

## 2015-09-26 LAB — URINE MICROSCOPIC-ADD ON: RBC / HPF: NONE SEEN RBC/hpf (ref 0–5)

## 2015-09-26 MED ORDER — IBUPROFEN 600 MG PO TABS: 600.0000 mg | ORAL_TABLET | Freq: Four times a day (QID) | ORAL | 0 refills | Status: DC | PRN

## 2015-09-26 MED ORDER — ONDANSETRON 4 MG PO TBDP: 4.0000 mg | ORAL_TABLET | Freq: Three times a day (TID) | ORAL | 0 refills | Status: DC | PRN

## 2015-09-26 MED ORDER — SULFAMETHOXAZOLE-TRIMETHOPRIM 800-160 MG PO TABS: 1.0000 | ORAL_TABLET | Freq: Two times a day (BID) | ORAL | 0 refills | Status: AC

## 2015-09-26 NOTE — ED Notes (Signed)
Attempted IV access [place] x1, unsuccessful.

## 2015-09-26 NOTE — ED Notes (Cosign Needed)
  Bedside ultrasound was used to localize a vein for cannulation. The target vein was mapped out in both a longitudinal and transverse fashion. It was compressible on ultrasound. Color Doppler was utilized as needed. Adjacent arteries were visualized and mapped out. The target vein was cannulated using ultrasound guidance in a standard format without any complications. The IV flushed without problems. The patient tolerated the procedure well and is NVI after the procedure.   Domenic Moras, PA-C 09/26/15 0100

## 2015-11-03 ENCOUNTER — Emergency Department (HOSPITAL_COMMUNITY)
Admission: EM | Admit: 2015-11-03 | Discharge: 2015-11-03 | Disposition: A | Discharge status: Home / Self Care | Payer: Self-pay | Attending: Emergency Medicine | Admitting: Emergency Medicine

## 2015-11-03 ENCOUNTER — Encounter (HOSPITAL_COMMUNITY): Payer: Self-pay | Admitting: Emergency Medicine

## 2015-11-03 DIAGNOSIS — Z7982 Long term (current) use of aspirin: Secondary | ICD-10-CM | POA: Insufficient documentation

## 2015-11-03 DIAGNOSIS — J302 Other seasonal allergic rhinitis: Secondary | ICD-10-CM | POA: Insufficient documentation

## 2015-11-03 MED ORDER — LORATADINE 10 MG PO TABS: 10.0000 mg | ORAL_TABLET | Freq: Every day | ORAL | 0 refills | Status: DC

## 2015-11-03 NOTE — ED Triage Notes (Signed)
Pt states she started having nasal congestion yesterday. No other complaints.

## 2015-11-03 NOTE — ED Provider Notes (Signed)
[facility] DEPT Provider Note   CSN: [id number] Arrival date & time: 11/03/15  1655  By signing my name below, I, Jeanell Sparrow, attest that this documentation has been prepared under the direction and in the presence of non-physician practitioner, Debroah Baller, NP. Electronically Signed: Jeanell Sparrow, Scribe. 11/03/2015. 5:45 PM.  History   Chief Complaint Chief Complaint  Patient presents with  . Nasal Congestion   The history is provided by the patient. No language interpreter was used.  Illness  This is a new problem. The current episode started yesterday. The problem occurs constantly. The problem has not changed since onset.Pertinent negatives include no abdominal pain. Nothing aggravates the symptoms. Nothing relieves the symptoms. Treatments tried: Nyquil. The treatment provided no relief.   HPI Comments: Morgan Waters is a 50 y.o. female who presents to the Emergency Department complaining of constant moderate nasal congestion that started yesterday. Associated symptoms include cough and postnasal drip. She took nyquil without any relief. Denies ear pain, sore throat, fever, chills, abdominal pain, nausea, vomiting, or diarrhea.    Past Medical History:  Diagnosis Date  . Migraine     There are no active problems to display for this patient.   Past Surgical History:  Procedure Laterality Date  . ABDOMINAL HYSTERECTOMY    . CESAREAN SECTION      OB History    No data available       Home Medications    Prior to Admission medications   Medication Sig Start Date End Date Taking? Authorizing Provider  Aspirin-Salicylamide-Caffeine (BC HEADACHE POWDER PO) Take 1 packet by mouth daily as needed. For pain/migraines    Historical Provider, MD  ibuprofen (ADVIL,MOTRIN) 600 MG tablet Take 1 tablet (600 mg total) by mouth every 6 (six) hours as needed. 09/26/15   Varney Biles, MD  loratadine (CLARITIN) 10 MG tablet Take 1 tablet (10 mg total) by mouth daily.  11/03/15   Bryona Foxworthy Bunnie Pion, NP  ondansetron (ZOFRAN ODT) 4 MG disintegrating tablet Take 1 tablet (4 mg total) by mouth every 8 (eight) hours as needed for nausea or vomiting. 09/26/15   Varney Biles, MD    Family History History reviewed. No pertinent family history.  Social History Social History  Substance Use Topics  . Smoking status: Never Smoker  . Smokeless tobacco: Never Used  . Alcohol use Yes     Comment: occasionally     Allergies   Review of patient's allergies indicates no known allergies.   Review of Systems Review of Systems  Constitutional: Negative for chills and fever.  HENT: Positive for congestion (Nasal ) and postnasal drip. Negative for ear pain and sore throat.   Respiratory: Positive for cough.   Gastrointestinal: Negative for abdominal pain, diarrhea, nausea and vomiting.     Physical Exam Updated Vital Signs BP 125/90   Pulse 88   Temp 98.4 F (36.9 C) (Oral)   Resp 18   Ht 5' (1.524 m)   Wt 116 lb (52.6 kg)   SpO2 100%   BMI 22.65 kg/m   Physical Exam  Constitutional: She appears well-developed and well-nourished. No distress.  HENT:  Head: Normocephalic and atraumatic.  Right Ear: Tympanic membrane normal.  Left Ear: Tympanic membrane normal.  Mouth/Throat: Uvula is midline.  Nasal congestion present.  Eyes: Conjunctivae are normal. Pupils are equal, round, and reactive to light. No scleral icterus.  Neck: Neck supple.  Cardiovascular: Normal rate and regular rhythm.  Exam reveals no gallop and no friction rub.  No murmur heard. Pulmonary/Chest: Effort normal. No respiratory distress. She has no wheezes. She has no rales.  Abdominal: She exhibits no distension.  Musculoskeletal: Normal range of motion.  Lymphadenopathy:    She has no cervical adenopathy.  Neurological: She is alert.  Skin: Skin is warm and dry.  Psychiatric: She has a normal mood and affect.  Nursing note and vitals reviewed.    ED Treatments / Results    DIAGNOSTIC STUDIES: Oxygen Saturation is 100% on RA, normal by my interpretation.    COORDINATION OF CARE: 5:49 PM- Pt advised of plan for treatment and pt agrees.  Labs (all labs ordered are listed, but only abnormal results are displayed) Labs Reviewed - No data to display  Radiology No results found.  Procedures Procedures (including critical care time)  Medications Ordered in ED Medications - No data to display   Initial Impression / Assessment and Plan / ED Course  I have reviewed the triage vital signs and the nursing notes.   Clinical Course    Final Clinical Impressions(s) / ED Diagnoses  50 y.o. female with nasal congestion stable for d/c without fever or other problems will treat with Claritin and she will f/u with her PCp or return here a needed.   Final diagnoses:  Seasonal allergies    New Prescriptions Discharge Medication List as of 11/03/2015  5:53 PM    START taking these medications   Details  loratadine (CLARITIN) 10 MG tablet Take 1 tablet (10 mg total) by mouth daily., Starting Tue 11/03/2015, Print       I personally performed the services described in this documentation, which was scribed in my presence. The recorded information has been reviewed and is accurate.     [place], [place] 11/05/15 [place], MD 11/06/15 [phone]

## 2016-01-07 ENCOUNTER — Emergency Department (HOSPITAL_COMMUNITY)
Admission: EM | Admit: 2016-01-07 | Discharge: 2016-01-07 | Disposition: A | Discharge status: Home / Self Care | Payer: Self-pay | Attending: Emergency Medicine | Admitting: Emergency Medicine

## 2016-01-07 ENCOUNTER — Encounter (HOSPITAL_COMMUNITY): Payer: Self-pay | Admitting: Emergency Medicine

## 2016-01-07 DIAGNOSIS — Z7982 Long term (current) use of aspirin: Secondary | ICD-10-CM | POA: Insufficient documentation

## 2016-01-07 DIAGNOSIS — G4440 Drug-induced headache, not elsewhere classified, not intractable: Secondary | ICD-10-CM

## 2016-01-07 DIAGNOSIS — T3995XA Adverse effect of unspecified nonopioid analgesic, antipyretic and antirheumatic, initial encounter: Secondary | ICD-10-CM

## 2016-01-07 DIAGNOSIS — G43009 Migraine without aura, not intractable, without status migrainosus: Secondary | ICD-10-CM | POA: Insufficient documentation

## 2016-01-07 MED ORDER — DIPHENHYDRAMINE HCL 25 MG PO CAPS
25.0000 mg | ORAL_CAPSULE | Freq: Once | ORAL | Status: AC
  Administered 2016-01-07: 25 mg via ORAL
  Filled 2016-01-07: qty 1

## 2016-01-07 MED ORDER — ASPIRIN EC 325 MG PO TBEC
650.0000 mg | DELAYED_RELEASE_TABLET | Freq: Once | ORAL | Status: AC
  Administered 2016-01-07: 650 mg via ORAL
  Filled 2016-01-07: qty 2

## 2016-01-07 MED ORDER — DEXAMETHASONE 4 MG PO TABS
10.0000 mg | ORAL_TABLET | Freq: Once | ORAL | Status: AC
  Administered 2016-01-07: 10 mg via ORAL
  Filled 2016-01-07: qty 3

## 2016-01-07 MED ORDER — NAPROXEN 500 MG PO TABS: 500.0000 mg | ORAL_TABLET | Freq: Two times a day (BID) | ORAL | 0 refills | Status: AC

## 2016-01-07 MED ORDER — METOCLOPRAMIDE HCL 10 MG PO TABS
10.0000 mg | ORAL_TABLET | Freq: Once | ORAL | Status: AC
  Administered 2016-01-07: 10 mg via ORAL
  Filled 2016-01-07: qty 1

## 2016-01-07 NOTE — ED Provider Notes (Signed)
[facility] DEPT Provider Note   CSN: [id number] Arrival date & time: 01/07/16  1739     History   Chief Complaint Chief Complaint  Patient presents with  . Headache    HPI Morgan Waters is a 50 y.o. female.  The history is provided by the patient.  Headache   This is a recurrent problem. The current episode started 12 to 24 hours ago. The problem occurs constantly. The problem has not changed since onset.The headache is associated with bright light. The pain is located in the frontal region. The quality of the pain is described as dull. The pain is moderate. The pain does not radiate. Pertinent negatives include no fever, no syncope, no nausea and no vomiting. Treatments tried: excedrin.    Past Medical History:  Diagnosis Date  . Migraine     There are no active problems to display for this patient.   Past Surgical History:  Procedure Laterality Date  . ABDOMINAL HYSTERECTOMY    . CESAREAN SECTION      OB History    No data available       Home Medications    Prior to Admission medications   Medication Sig Start Date End Date Taking? Authorizing Provider  Aspirin-Salicylamide-Caffeine (BC HEADACHE POWDER PO) Take 1 packet by mouth daily as needed. For pain/migraines    Historical Provider, MD  ibuprofen (ADVIL,MOTRIN) 600 MG tablet Take 1 tablet (600 mg total) by mouth every 6 (six) hours as needed. 09/26/15   Varney Biles, MD  loratadine (CLARITIN) 10 MG tablet Take 1 tablet (10 mg total) by mouth daily. 11/03/15   Hope Bunnie Pion, NP  naproxen (NAPROSYN) 500 MG tablet Take 1 tablet (500 mg total) by mouth 2 (two) times daily with a meal. 01/07/16 01/11/16  Leo Grosser, MD  ondansetron (ZOFRAN ODT) 4 MG disintegrating tablet Take 1 tablet (4 mg total) by mouth every 8 (eight) hours as needed for nausea or vomiting. 09/26/15   Varney Biles, MD    Family History History reviewed. No pertinent family history.  Social History Social History  Substance  Use Topics  . Smoking status: Never Smoker  . Smokeless tobacco: Never Used  . Alcohol use Yes     Comment: occasionally     Allergies   Patient has no known allergies.   Review of Systems Review of Systems  Constitutional: Negative for fever.  Cardiovascular: Negative for syncope.  Gastrointestinal: Negative for nausea and vomiting.  Neurological: Positive for headaches.  All other systems reviewed and are negative.    Physical Exam Updated Vital Signs BP 105/58 (BP Location: Right Arm)   Pulse 68   Temp 98 F (36.7 C) (Oral)   Resp 14   SpO2 100%   Physical Exam  Constitutional: She is oriented to person, place, and time. She appears well-developed and well-nourished. No distress.  HENT:  Head: Normocephalic.  Nose: Nose normal.  Eyes: Conjunctivae are normal.  Neck: Neck supple. No tracheal deviation present.  Cardiovascular: Normal rate and regular rhythm.   Pulmonary/Chest: Effort normal. No respiratory distress.  Abdominal: Soft. She exhibits no distension.  Neurological: She is alert and oriented to person, place, and time. She has normal strength. No cranial nerve deficit or sensory deficit. Coordination normal. GCS eye subscore is 4. GCS verbal subscore is 5. GCS motor subscore is 6.  Normal finger to nose testing and rapid alternating movement   Skin: Skin is warm and dry.  Psychiatric: She has a normal mood  and affect.     ED Treatments / Results  Labs (all labs ordered are listed, but only abnormal results are displayed) Labs Reviewed - No data to display  EKG  EKG Interpretation None       Radiology No results found.  Procedures Procedures (including critical care time)  Medications Ordered in ED Medications  metoCLOPramide (REGLAN) tablet 10 mg (10 mg Oral Given 01/07/16 1903)  diphenhydrAMINE (BENADRYL) capsule 25 mg (25 mg Oral Given 01/07/16 1904)  dexamethasone (DECADRON) tablet 10 mg (10 mg Oral Given 01/07/16 1904)  aspirin EC  tablet 650 mg (650 mg Oral Given 01/07/16 1903)     Initial Impression / Assessment and Plan / ED Course  I have reviewed the triage vital signs and the nursing notes.  Pertinent labs & imaging results that were available during my care of the patient were reviewed by me and considered in my medical decision making (see chart for details).  Clinical Course    50 y.o. female presents with migraine headache that is similar to prior starting today. Not responding to supportive care measures at home. No neurologic deficits here and otherwise well appearing despite discomfort. No red flag symptoms for headache, no signs or symptoms of spontaneous ICH. Provided migraine cocktail. Recommended avoiding caffeine containing compounds including excedrin as they can exacerbate symptoms. Plan to follow up with PCP as needed and return precautions discussed for worsening or new concerning symptoms.   Final Clinical Impressions(s) / ED Diagnoses   Final diagnoses:  Migraine without aura and without status migrainosus, not intractable  Analgesic rebound headache    New Prescriptions Discharge Medication List as of 01/07/2016  6:54 PM    START taking these medications   Details  naproxen (NAPROSYN) 500 MG tablet Take 1 tablet (500 mg total) by mouth 2 (two) times daily with a meal., Starting Thu 01/07/2016, Until Mon 01/11/2016, Print         Leo Grosser, MD 01/07/16 2332

## 2016-01-07 NOTE — ED Triage Notes (Signed)
Pt sts frontal HA that feels like migraine that started today; pt sts some nausea

## 2016-03-29 ENCOUNTER — Encounter (HOSPITAL_COMMUNITY): Payer: Self-pay

## 2016-03-29 ENCOUNTER — Emergency Department (HOSPITAL_COMMUNITY)
Admission: EM | Admit: 2016-03-29 | Discharge: 2016-03-29 | Disposition: A | Discharge status: Home / Self Care | Payer: Self-pay | Attending: Emergency Medicine | Admitting: Emergency Medicine

## 2016-03-29 DIAGNOSIS — Y939 Activity, unspecified: Secondary | ICD-10-CM | POA: Insufficient documentation

## 2016-03-29 DIAGNOSIS — Y929 Unspecified place or not applicable: Secondary | ICD-10-CM | POA: Insufficient documentation

## 2016-03-29 DIAGNOSIS — S39012A Strain of muscle, fascia and tendon of lower back, initial encounter: Secondary | ICD-10-CM | POA: Insufficient documentation

## 2016-03-29 DIAGNOSIS — Z79899 Other long term (current) drug therapy: Secondary | ICD-10-CM | POA: Insufficient documentation

## 2016-03-29 DIAGNOSIS — X500XXA Overexertion from strenuous movement or load, initial encounter: Secondary | ICD-10-CM | POA: Insufficient documentation

## 2016-03-29 DIAGNOSIS — Y990 Civilian activity done for income or pay: Secondary | ICD-10-CM | POA: Insufficient documentation

## 2016-03-29 MED ORDER — IBUPROFEN 600 MG PO TABS: 600.0000 mg | ORAL_TABLET | Freq: Four times a day (QID) | ORAL | 0 refills | Status: DC | PRN

## 2016-03-29 MED ORDER — METHOCARBAMOL 500 MG PO TABS: 500.0000 mg | ORAL_TABLET | Freq: Two times a day (BID) | ORAL | 0 refills | Status: DC | PRN

## 2016-03-29 NOTE — ED Notes (Signed)
ED Provider at bedside. 

## 2016-03-29 NOTE — ED Provider Notes (Signed)
[facility] DEPT Provider Note   CSN: [id number] Arrival date & time: 03/29/16  2134  By signing my name below, I, Evelene Croon, attest that this documentation has been prepared under the direction and in the presence of Pearlie Oyster, PA-C Electronically Signed: Evelene Croon, Scribe. 03/29/2016. 11:07 PM.  History   Chief Complaint Chief Complaint  Patient presents with  . Back Pain   The history is provided by the patient. No language interpreter was used.    HPI Comments:  Morgan Waters is a 51 y.o. female who presents to the Emergency Department complaining of gradual onset lower back pain which began today while at work. 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 Pt states her pain is worse with movement and when seated upright. Denies upper back or neck pain, Denies fever, saddle anesthesia, weakness, numbness, urinary complaints including retention/incontinence. No history of cancer, IVDU, or recent spinal procedures. No medications taken prior to arrival for symptoms. No alleviating factors noted.  Past Medical History:  Diagnosis Date  . Migraine     There are no active problems to display for this patient.   Past Surgical History:  Procedure Laterality Date  . ABDOMINAL HYSTERECTOMY    . CESAREAN SECTION      OB History    No data available       Home Medications    Prior to Admission medications   Medication Sig Start Date End Date Taking? Authorizing Provider  Aspirin-Salicylamide-Caffeine (BC HEADACHE POWDER PO) Take 1 packet by mouth daily as needed. For pain/migraines    Historical Provider, MD  ibuprofen (ADVIL,MOTRIN) 600 MG tablet Take 1 tablet (600 mg total) by mouth every 6 (six) hours as needed. 03/29/16   Ozella Almond Ward, PA-C  loratadine (CLARITIN) 10 MG tablet Take 1 tablet (10 mg total) by mouth daily. 11/03/15   Hope Bunnie Pion, NP  methocarbamol (ROBAXIN) 500 MG tablet Take 1 tablet (500 mg total)  by mouth 2 (two) times daily as needed for muscle spasms. 03/29/16   Ozella Almond Ward, PA-C  ondansetron (ZOFRAN ODT) 4 MG disintegrating tablet Take 1 tablet (4 mg total) by mouth every 8 (eight) hours as needed for nausea or vomiting. 09/26/15   Varney Biles, MD    Family History No family history on file.  Social History Social History  Substance Use Topics  . Smoking status: Never Smoker  . Smokeless tobacco: Never Used  . Alcohol use Yes     Comment: occasionally     Allergies   Patient has no known allergies.   Review of Systems Review of Systems  Musculoskeletal: Positive for back pain.  Neurological: Negative for weakness and numbness.     Physical Exam Updated Vital Signs BP 103/72   Pulse 74   Temp 98.6 F (37 C)   Resp 20   Ht 5\' 3"  (1.6 m)   Wt 53.5 kg   SpO2 98%   BMI 20.90 kg/m   Physical Exam  Constitutional: She is oriented to person, place, and time. She appears well-developed and well-nourished. No distress.  HENT:  Head: Normocephalic and atraumatic.  Cardiovascular: Normal rate, regular rhythm and normal heart sounds.   No murmur heard. Pulmonary/Chest: Effort normal and breath sounds normal. No respiratory distress.  Abdominal: Soft. She exhibits no distension. There is no tenderness.  Musculoskeletal: She exhibits no edema.  No C/T/L spine tenderness Tenderness to left lumbar paraspinal muscles SLR is negative bilaterally  5/5 muscle strength of lower extremities   Neurological: She is alert and oriented to person, place, and time.  BLE NVI  Skin: Skin is warm and dry.  Nursing note and vitals reviewed.    ED Treatments / Results  DIAGNOSTIC STUDIES:  Oxygen Saturation is 98% on RA, normal by my interpretation.    COORDINATION OF CARE:  11:03 PM Discussed treatment plan with pt at bedside and pt agreed to plan.  Labs (all labs ordered are listed, but only abnormal results are displayed) Labs Reviewed - No data to  display  EKG  EKG Interpretation None       Radiology No results found.  Procedures Procedures (including critical care time)  Medications Ordered in ED Medications - No data to display   Initial Impression / Assessment and Plan / ED Course  I have reviewed the triage vital signs and the nursing notes.  Pertinent labs & imaging results that were available during my care of the patient were reviewed by me and considered in my medical decision making (see chart for details).     Morgan Waters  presents with left lower back pain which began today shortly after lifting a mattress. Patient demonstrates no lower extremity weakness, saddle anesthesia, bowel or bladder incontinence, or neuro deficits. No concern for cauda equina. No fevers or other infectious symptoms to suggest that the patient's back pain is due to an infection. Lower extremities are neurovascularly intact and patient is ambulating without difficulty. I have reviewed return precautions, including the development of any of these signs or symptoms, and the patient has voiced understanding. I reviewed supportive care instructions, including NSAIDs, early range of motion exercises, and PCP follow-up if symptoms persist. RX for ibuprofen and robaxin. Patient voiced understanding and agreement with plan.   Final Clinical Impressions(s) / ED Diagnoses   Final diagnoses:  Strain of lumbar region, initial encounter    New Prescriptions Discharge Medication List as of 03/29/2016 11:09 PM    START taking these medications   Details  methocarbamol (ROBAXIN) 500 MG tablet Take 1 tablet (500 mg total) by mouth 2 (two) times daily as needed for muscle spasms., Starting Tue 03/29/2016, Print       I personally performed the services described in this documentation, which was scribed in my presence. The recorded information has been reviewed and is accurate.    [facility] Ward, PA-C 03/30/16 [place],  DO 03/30/16 [id number]

## 2016-03-29 NOTE — ED Triage Notes (Signed)
Pt states that she was working today, went to lift a mattress and started having sharp lower back pain.

## 2016-03-29 NOTE — Discharge Instructions (Signed)
Ibuprofen as needed for pain. Robaxin is your muscle relaxer to take as needed.   Back Pain:  Your back pain should be treated with medicines such as ibuprofen or aleve and this back pain should get better over the next 2 weeks.  However if you develop severe or worsening pain, low back pain with fever, numbness, weakness or inability to walk or urinate, you should return to the ER immediately.  Please follow up with your doctor this week for a recheck if still having symptoms.  Low back pain is discomfort in the lower back that may be due to injuries to muscles and ligaments around the spine. Occasionally, it may be caused by a a problem to a part of the spine called a disc. The pain may last several days or a week;  However, most patients get completely well in 4 weeks.  COLD THERAPY DIRECTIONS:  Ice or gel packs can be used to reduce both pain and swelling. Ice is the most helpful within the first 24 to 48 hours after an injury or flareup from overusing a muscle or joint.  Ice is effective, has very few side effects, and is safe for most people to use.   If you expose your skin to cold temperatures for too long or without the proper protection, you can damage your skin or nerves. Watch for signs of skin damage due to cold.   HOME CARE INSTRUCTIONS  Follow these tips to use ice and cold packs safely.  Place a dry or damp towel between the ice and skin. A damp towel will cool the skin more quickly, so you may need to shorten the time that the ice is used.  For a more rapid response, add gentle compression to the ice.  Ice for no more than 10 to 20 minutes at a time. The bonier the area you are icing, the less time it will take to get the benefits of ice.  Check your skin after 5 minutes to make sure there are no signs of a poor response to cold or skin damage.  Rest 20 minutes or more in between uses.  Once your skin is numb, you can end your treatment. You can test numbness by very lightly  touching your skin. The touch should be so light that you do not see the skin dimple from the pressure of your fingertip. When using ice, most people will feel these normal sensations in this order: cold, burning, aching, and numbness.   You will need to follow up with  Your primary healthcare provider in 1-2 weeks for reassessment.  Be aware that if you develop new symptoms, such as a fever, leg weakness, difficulty with or loss of control of your urine or bowels, abdominal pain, or more severe pain, you will need to seek medical attention and  / or return to the Emergency department.

## 2016-04-01 ENCOUNTER — Encounter (HOSPITAL_COMMUNITY): Payer: Self-pay | Admitting: Emergency Medicine

## 2016-04-01 ENCOUNTER — Emergency Department (HOSPITAL_COMMUNITY)
Admission: EM | Admit: 2016-04-01 | Discharge: 2016-04-01 | Disposition: A | Discharge status: Home / Self Care | Payer: Self-pay | Attending: Emergency Medicine | Admitting: Emergency Medicine

## 2016-04-01 DIAGNOSIS — Z7982 Long term (current) use of aspirin: Secondary | ICD-10-CM | POA: Insufficient documentation

## 2016-04-01 DIAGNOSIS — S39012D Strain of muscle, fascia and tendon of lower back, subsequent encounter: Secondary | ICD-10-CM | POA: Insufficient documentation

## 2016-04-01 DIAGNOSIS — X58XXXD Exposure to other specified factors, subsequent encounter: Secondary | ICD-10-CM | POA: Insufficient documentation

## 2016-04-01 NOTE — ED Triage Notes (Signed)
Pt was seen  On 1/6 for back pain and  meds given did not help and she missed anextra day of work was told to come back for  Back pain not resolved and  Work note

## 2016-04-01 NOTE — ED Notes (Signed)
Pt states she was seen for back pain earlier this week and had continued back pain and unable to work 2/8 here for pain and to get work note to cover 2/8.

## 2016-04-01 NOTE — ED Notes (Signed)
Pt state she understands instructions. Home stable with steady gait ands daughter

## 2016-04-01 NOTE — ED Provider Notes (Signed)
[facility] DEPT Provider Note   CSN: [id number] Arrival date & time: 04/01/16  1115  By signing my name below, I, Neta Mends, attest that this documentation has been prepared under the direction and in the presence of Domenic Moras, PA-C. Electronically Signed: Neta Mends, ED Scribe. 04/01/2016. 2:25 PM.    History   Chief Complaint Chief Complaint  Patient presents with  . Back Pain    The history is provided by the patient. No language interpreter was used.   HPI Comments:  Morgan Waters is a 51 y.o. female who presents to the Emergency Department complaining of constant back pain for 1 month. Pt was seen on 02/27/16 following a back injury at work and was treated with a medication that has provided no relief. 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 Pt rates the pain at 9/10, and describes the pain as "sharp" and non-radiating. Pt denies hx of IV drug use or cancer. Pt is ambulatory. No alleviating factors noted. Pt denies fever, dysuria, numbness.   Past Medical History:  Diagnosis Date  . Migraine     There are no active problems to display for this patient.   Past Surgical History:  Procedure Laterality Date  . ABDOMINAL HYSTERECTOMY    . CESAREAN SECTION      OB History    No data available       Home Medications    Prior to Admission medications   Medication Sig Start Date End Date Taking? Authorizing Provider  Aspirin-Salicylamide-Caffeine (BC HEADACHE POWDER PO) Take 1 packet by mouth daily as needed. For pain/migraines    Historical Provider, MD  ibuprofen (ADVIL,MOTRIN) 600 MG tablet Take 1 tablet (600 mg total) by mouth every 6 (six) hours as needed. 03/29/16   Ozella Almond Ward, PA-C  loratadine (CLARITIN) 10 MG tablet Take 1 tablet (10 mg total) by mouth daily. 11/03/15   Hope Bunnie Pion, NP  methocarbamol (ROBAXIN) 500 MG tablet Take 1 tablet (500 mg total) by mouth 2 (two) times daily as needed for muscle spasms. 03/29/16   Ozella Almond Ward, PA-C  ondansetron (ZOFRAN ODT) 4 MG disintegrating tablet Take 1 tablet (4 mg total) by mouth every 8 (eight) hours as needed for nausea or vomiting. 09/26/15   Varney Biles, MD    Family History No family history on file.  Social History Social History  Substance Use Topics  . Smoking status: Never Smoker  . Smokeless tobacco: Never Used  . Alcohol use Yes     Comment: occasionally     Allergies   Patient has no known allergies.   Review of Systems Review of Systems  Constitutional: Negative for fever.  Genitourinary: Negative for dysuria.  Musculoskeletal: Positive for back pain.  Neurological: Negative for numbness.     Physical Exam Updated Vital Signs BP 98/68 (BP Location: Right Arm)   Pulse 64   Temp 97.9 F (36.6 C) (Oral)   Resp 18   SpO2 99%   Physical Exam  Constitutional: She appears well-developed and well-nourished. No distress.  HENT:  Head: Normocephalic and atraumatic.  Eyes: Conjunctivae are normal.  Cardiovascular: Normal rate.   Pulmonary/Chest: Effort normal.  Abdominal: Soft. She exhibits no distension.  Musculoskeletal:  Tenderness along lumbar midline spine from L2 to L4 with no crepitus or stepoff. No overlying skin changes.   Neurological: She is alert.  Skin: Skin is warm and dry.  Psychiatric: She has a normal mood and affect.  Nursing  note and vitals reviewed.    ED Treatments / Results  DIAGNOSTIC STUDIES:  Oxygen Saturation is 99% on RA, normal by my interpretation.    COORDINATION OF CARE:  2:24 PM Discussed treatment plan with pt at bedside and pt agreed to plan.   Labs (all labs ordered are listed, but only abnormal results are displayed) Labs Reviewed - No data to display  EKG  EKG Interpretation None       Radiology No results found.  Procedures Procedures (including critical care time)  Medications Ordered in ED Medications - No data to display   Initial Impression / Assessment and  Plan / ED Course  I have reviewed the triage vital signs and the nursing notes.  Pertinent labs & imaging results that were available during my care of the patient were reviewed by me and considered in my medical decision making (see chart for details).     BP 109/69 (BP Location: Left Arm)   Pulse 64   Temp 97.9 F (36.6 C)   Resp 16   SpO2 100%    Final Clinical Impressions(s) / ED Diagnoses   Final diagnoses:  Strain of lumbar region, subsequent encounter    New Prescriptions Discharge Medication List as of 04/01/2016  2:27 PM      I personally performed the services described in this documentation, which was scribed in my presence. The recorded information has been reviewed and is accurate.   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  NO concerning feature.  Will provide sxs treatment.     Domenic Moras, PA-C 04/01/16 [place], MD 04/11/16 [phone]

## 2016-04-29 ENCOUNTER — Encounter (HOSPITAL_COMMUNITY): Payer: Self-pay | Admitting: *Deleted

## 2016-04-29 ENCOUNTER — Emergency Department (HOSPITAL_COMMUNITY)
Admission: EM | Admit: 2016-04-29 | Discharge: 2016-04-30 | Disposition: A | Discharge status: Home / Self Care | Payer: Medicaid Other | Attending: Emergency Medicine | Admitting: Emergency Medicine

## 2016-04-29 DIAGNOSIS — R233 Spontaneous ecchymoses: Secondary | ICD-10-CM

## 2016-04-29 DIAGNOSIS — S7011XA Contusion of right thigh, initial encounter: Secondary | ICD-10-CM | POA: Insufficient documentation

## 2016-04-29 DIAGNOSIS — Y929 Unspecified place or not applicable: Secondary | ICD-10-CM | POA: Diagnosis not present

## 2016-04-29 DIAGNOSIS — D693 Immune thrombocytopenic purpura: Secondary | ICD-10-CM | POA: Diagnosis not present

## 2016-04-29 DIAGNOSIS — Y999 Unspecified external cause status: Secondary | ICD-10-CM | POA: Insufficient documentation

## 2016-04-29 DIAGNOSIS — R238 Other skin changes: Secondary | ICD-10-CM

## 2016-04-29 DIAGNOSIS — S8012XA Contusion of left lower leg, initial encounter: Secondary | ICD-10-CM | POA: Diagnosis not present

## 2016-04-29 DIAGNOSIS — S60211A Contusion of right wrist, initial encounter: Secondary | ICD-10-CM | POA: Insufficient documentation

## 2016-04-29 DIAGNOSIS — D508 Other iron deficiency anemias: Secondary | ICD-10-CM | POA: Insufficient documentation

## 2016-04-29 DIAGNOSIS — S8011XA Contusion of right lower leg, initial encounter: Secondary | ICD-10-CM | POA: Diagnosis not present

## 2016-04-29 DIAGNOSIS — X58XXXA Exposure to other specified factors, initial encounter: Secondary | ICD-10-CM | POA: Insufficient documentation

## 2016-04-29 DIAGNOSIS — Y939 Activity, unspecified: Secondary | ICD-10-CM | POA: Diagnosis not present

## 2016-04-29 DIAGNOSIS — S8991XA Unspecified injury of right lower leg, initial encounter: Secondary | ICD-10-CM | POA: Diagnosis present

## 2016-04-29 LAB — CBC WITH DIFFERENTIAL/PLATELET
BASOS PCT: 0 %
Basophils Absolute: 0 10*3/uL (ref 0.0–0.1)
EOS ABS: 0 10*3/uL (ref 0.0–0.7)
Eosinophils Relative: 1 %
HCT: 19.9 % — ABNORMAL LOW (ref 36.0–46.0)
Hemoglobin: 7 g/dL — ABNORMAL LOW (ref 12.0–15.0)
Lymphocytes Relative: 54 %
Lymphs Abs: 1.3 10*3/uL (ref 0.7–4.0)
MCH: 33.7 pg (ref 26.0–34.0)
MCHC: 35.2 g/dL (ref 30.0–36.0)
MCV: 95.7 fL (ref 78.0–100.0)
Monocytes Absolute: 0 10*3/uL — ABNORMAL LOW (ref 0.1–1.0)
Monocytes Relative: 2 %
NEUTROS PCT: 43 %
Neutro Abs: 0.9 10*3/uL — ABNORMAL LOW (ref 1.7–7.7)
Platelets: 54 10*3/uL — ABNORMAL LOW (ref 150–400)
RBC: 2.08 MIL/uL — ABNORMAL LOW (ref 3.87–5.11)
RDW: 16.7 % — ABNORMAL HIGH (ref 11.5–15.5)
WBC: 2.2 10*3/uL — AB (ref 4.0–10.5)

## 2016-04-29 LAB — BASIC METABOLIC PANEL
Anion gap: 6 (ref 5–15)
BUN: 16 mg/dL (ref 6–20)
CO2: 26 mmol/L (ref 22–32)
CREATININE: 0.87 mg/dL (ref 0.44–1.00)
Calcium: 8.9 mg/dL (ref 8.9–10.3)
Chloride: 107 mmol/L (ref 101–111)
GFR calc Af Amer: >60 mL/min (ref >60)
Glucose, Bld: 90 mg/dL (ref 65–99)
POTASSIUM: 3.8 mmol/L (ref 3.5–5.1)
Sodium: 139 mmol/L (ref 135–145)

## 2016-04-29 LAB — HEPATIC FUNCTION PANEL
ALBUMIN: 3.6 g/dL (ref 3.5–5.0)
ALT: 10 U/L — AB (ref 14–54)
AST: 23 U/L (ref 15–41)
Alkaline Phosphatase: 65 U/L (ref 38–126)
Bilirubin, Direct: 0.2 mg/dL (ref 0.1–0.5)
Indirect Bilirubin: 0.5 mg/dL (ref 0.3–0.9)
TOTAL PROTEIN: 6.9 g/dL (ref 6.5–8.1)
Total Bilirubin: 0.7 mg/dL (ref 0.3–1.2)

## 2016-04-29 LAB — LACTATE DEHYDROGENASE: LDH: 203 U/L — ABNORMAL HIGH (ref 98–192)

## 2016-04-29 LAB — FERRITIN: FERRITIN: 76 ng/mL (ref 11–307)

## 2016-04-29 LAB — VITAMIN B12: Vitamin B-12: 340 pg/mL (ref 180–914)

## 2016-04-29 LAB — SEDIMENTATION RATE: SED RATE: 42 mm/h — AB (ref 0–22)

## 2016-04-29 LAB — RETICULOCYTES
RBC.: 2.16 MIL/uL — AB (ref 3.87–5.11)
Retic Count, Absolute: 34.6 10*3/uL (ref 19.0–186.0)
Retic Ct Pct: 1.6 % (ref 0.4–3.1)

## 2016-04-29 LAB — PREPARE RBC (CROSSMATCH)

## 2016-04-29 LAB — FOLATE: Folate: 7.8 ng/mL (ref >5.9)

## 2016-04-29 MED ORDER — SODIUM CHLORIDE 0.9 % IV SOLN
10.0000 mL/h | Freq: Once | INTRAVENOUS | Status: AC
  Administered 2016-04-29: 10 mL/h via INTRAVENOUS

## 2016-04-29 NOTE — ED Provider Notes (Signed)
[facility] DEPT Provider Note   CSN: [id number] Arrival date & time: 04/29/16  1817  By signing my name below, I, Hansel Feinstein, attest that this documentation has been prepared under the direction and in the presence of  [place]. Janit Bern, NP. Electronically Signed: Hansel Feinstein, ED Scribe. 04/29/16. 7:17 PM.    History   Chief Complaint Chief Complaint  Patient presents with  . Leg Pain    HPI Morgan Waters is a 51 y.o. female otherwise healthy on no daily medications who presents to the Emergency Department complaining of diffuse, intermittent bruising to bilateral legs and right forearm that began this month with associated generalized fatigue. Pt states she does not work and denies recent fall, injury or trauma. She states the areas just "popped up" and began on her inner thigh. Pt states her husband is concerned that she is tired all the time. No worsening or alleviating factors noted. Pt is not currently taking any anticoagulants.  She denies leg pain, SOB, CP, HA, fever, chills, difficulty urinating, dysuria, frequency, hematuria, vaginal bleeding or discharge, bleeding easily, diarrhea, cough, congestion, abdominal pain, vomiting, additional complaints.     The history is provided by the patient. No language interpreter was used.  Leg Pain   This is a new problem. The current episode started more than 1 week ago. The problem occurs constantly. The problem has been gradually worsening. The pain is present in the left upper leg, left lower leg, right upper leg, right lower leg and right arm. The patient is experiencing no pain. She has tried nothing for the symptoms. The treatment provided no relief. There has been no history of extremity trauma.    Past Medical History:  Diagnosis Date  . Migraine     There are no active problems to display for this patient.   Past Surgical History:  Procedure Laterality Date  . ABDOMINAL HYSTERECTOMY    . CESAREAN SECTION      OB  History    No data available       Home Medications    Prior to Admission medications   Not on File    Family History No family history on file.  Social History Social History  Substance Use Topics  . Smoking status: Never Smoker  . Smokeless tobacco: Never Used  . Alcohol use Yes     Comment: occasionally     Allergies   Patient has no known allergies.   Review of Systems Review of Systems  Constitutional: Positive for fatigue. Negative for chills and fever.  HENT: Negative for congestion.   Respiratory: Negative for cough and shortness of breath.   Cardiovascular: Negative for chest pain and leg swelling.  Gastrointestinal: Negative for abdominal pain, diarrhea, nausea and vomiting.  Genitourinary: Negative for difficulty urinating, dysuria, frequency, hematuria, vaginal bleeding and vaginal discharge.  Musculoskeletal: Negative for myalgias.  Skin: Positive for color change (bruising).  Neurological: Negative for headaches.  Hematological: Does not bruise/bleed easily.  Psychiatric/Behavioral: Negative for confusion and sleep disturbance.     Physical Exam Updated Vital Signs BP 100/66   Pulse 62   Temp 98 F (36.7 C)   Resp 16   Ht 5' (1.524 m)   Wt 53.5 kg   SpO2 98%   BMI 23.05 kg/m   Physical Exam  Constitutional: She appears well-developed and well-nourished.  HENT:  Head: Normocephalic.  Eyes: Conjunctivae are normal.  Cardiovascular: Normal rate.   Pulmonary/Chest: Effort normal. No respiratory distress.  Abdominal: She  exhibits no distension.  Musculoskeletal: Normal range of motion.  Neurological: She is alert.  Skin: Skin is warm and dry.  Areas of ecchymosis to the right lower leg. She has one 2 cm area to the anterior aspect just below the knee, 1 cm area that is midway of the lower leg, 1.5 cm area to the lateral aspect of the right leg, 2 cm area to the lateral right thigh, 1 cm to the left lower leg just below the knee, 4 cm area  to the palmar aspect of the right wrist.   Psychiatric: She has a normal mood and affect. Her behavior is normal.  Nursing note and vitals reviewed.    ED Treatments / Results   DIAGNOSTIC STUDIES: Oxygen Saturation is 99% on RA, normal by my interpretation.    COORDINATION OF CARE: 7:16 PM Discussed treatment plan with pt at bedside which includes labs and pt agreed to plan.    Labs (all labs ordered are listed, but only abnormal results are displayed) Labs Reviewed  CBC WITH DIFFERENTIAL/PLATELET - Abnormal; Notable for the following:       Result Value   WBC 2.2 (*)    RBC 2.08 (*)    Hemoglobin 7.0 (*)    HCT 19.9 (*)    RDW 16.7 (*)    Platelets 54 (*)    Neutro Abs 0.9 (*)    Monocytes Absolute 0.0 (*)    All other components within normal limits  RETICULOCYTES - Abnormal; Notable for the following:    RBC. 2.16 (*)    All other components within normal limits  LACTATE DEHYDROGENASE - Abnormal; Notable for the following:    LDH 203 (*)    All other components within normal limits  SEDIMENTATION RATE - Abnormal; Notable for the following:    Sed Rate 42 (*)    All other components within normal limits  HEPATIC FUNCTION PANEL - Abnormal; Notable for the following:    ALT 10 (*)    All other components within normal limits  BASIC METABOLIC PANEL  FERRITIN  VITAMIN B12  FOLATE  HAPTOGLOBIN  PREPARE RBC (CROSSMATCH)  TYPE AND SCREEN  ABO/RH    Procedures Procedures (including critical care time)  Medications Ordered in ED Medications  0.9 %  sodium chloride infusion (0 mL/hr Intravenous Stopped 04/30/16 0017)     Initial Impression / Assessment and Plan / ED Course  I have reviewed the triage vital signs and the nursing notes.  Pertinent labs results that were available during my care of the patient were reviewed by me and considered in my medical decision making (see chart for details).     Discussed case with Dr. Maryan Rued, who agrees with the plan  of care.   9:17 PM Consulted hematology/oncology, Dr. Burr Medico, who requests 1 unit of blood, iron work up and f/u in office Monday at 3:15, when she will be scheduled for a bone marrow study.   Morgan Waters presents to the ED for evaluation of bruising that started one month ago and fatigue. 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 Patient advised to follow up with Dr. Burr Medico as scheduled. Patient appears stable for discharge at this time. Return precautions discussed and outlined in discharge paperwork. Patient is agreeable to plan.     Final Clinical Impressions(s) / ED Diagnoses   Final diagnoses:  Acute ITP ([place])  Abnormal bruising  Other iron deficiency anemia    New Prescriptions Current Discharge Medication List  I personally performed the services described in this documentation, which was scribed in my presence. The recorded information has been reviewed and is accurate.    [address] [place], [place] 04/30/16 [place], MD 05/02/16 2055

## 2016-04-29 NOTE — ED Triage Notes (Signed)
The pt is c/o rt leg bruising for at least 2 months  No injury  No swelling she does not take aspirin  She came here today because her aunt told her she may be having blood clots no history lmp none

## 2016-04-30 LAB — TYPE AND SCREEN
ABO/RH(D): O POS
ANTIBODY SCREEN: NEGATIVE
UNIT DIVISION: 0

## 2016-04-30 LAB — ABO/RH: ABO/RH(D): O POS

## 2016-04-30 LAB — BPAM RBC
Blood Product Expiration Date: 201804022359
ISSUE DATE / TIME: 201803092358
UNIT TYPE AND RH: 5100

## 2016-04-30 NOTE — ED Notes (Signed)
Blood rate increased to 178ml  No reaction from the first 15 minutes of blood tgransfusion

## 2016-04-30 NOTE — ED Notes (Signed)
Unit of blood started at 189ml

## 2016-04-30 NOTE — ED Notes (Addendum)
Unit of blood infused  No reaction

## 2016-04-30 NOTE — Discharge Instructions (Signed)
Dr. Burr Medico wants to see you Monday 05/02/2016 @ 3:15 in the office for further evaluation and treatment. If you have problems before then return here or go to [facility] ED.

## 2016-05-01 LAB — HAPTOGLOBIN: Haptoglobin: 107 mg/dL (ref 34–200)

## 2016-05-03 ENCOUNTER — Telehealth: Payer: Self-pay | Admitting: Hematology

## 2016-05-03 LAB — PATHOLOGIST SMEAR REVIEW

## 2016-05-03 NOTE — Telephone Encounter (Signed)
Patient called and said that she would be here at the 3/14 2pm appointment

## 2016-05-03 NOTE — Telephone Encounter (Signed)
Patient called and needs to reschedule her appointment

## 2016-05-03 NOTE — Telephone Encounter (Signed)
Lft vm w/appt for 3/14 at 2pm.

## 2016-05-04 ENCOUNTER — Telehealth: Payer: Self-pay | Admitting: Hematology

## 2016-05-04 ENCOUNTER — Ambulatory Visit (HOSPITAL_BASED_OUTPATIENT_CLINIC_OR_DEPARTMENT_OTHER): Payer: Self-pay | Admitting: Hematology

## 2016-05-04 ENCOUNTER — Encounter: Payer: Self-pay | Admitting: Hematology

## 2016-05-04 DIAGNOSIS — Z832 Family history of diseases of the blood and blood-forming organs and certain disorders involving the immune mechanism: Secondary | ICD-10-CM

## 2016-05-04 DIAGNOSIS — D649 Anemia, unspecified: Secondary | ICD-10-CM

## 2016-05-04 DIAGNOSIS — D61818 Other pancytopenia: Secondary | ICD-10-CM | POA: Insufficient documentation

## 2016-05-04 NOTE — Telephone Encounter (Signed)
Scheduled appts per 05/04/2016 los. Patient talked to [facility] to schedule CT Biopsy. Dr Burr Medico aware of scheduling conflict. Patient aware [facility] may call back again with new schedule.

## 2016-05-04 NOTE — Progress Notes (Signed)
[place]  [phone] [phone]  Clinic New consult Note   Patient Care Team: No Pcp Per Patient as PCP - General (General Practice) 05/04/2016  REFERRAL PHYSICIAN: ED Dr. Maryan Waters   CHIEF COMPLAINTS/PURPOSE OF CONSULTATION:  Pancytopenia  HISTORY OF PRESENTING ILLNESS:  Morgan Waters 51 y.o. female who presented to the ED on 04/29/16 for fatigue and diffuse intermittent bruising to her bilateral legs and right forearm that started 1 month prior. CBC revealed Hb 7.0, low WBC, RBC, HCT, and elevated RDW. Platelets were low at 54K. Blood smear revealed pancytopenia including neutropenia. 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 I was subsequently contacted and I requested 1 unit of blood, iron work up, and a follow up.  The patient has a history of anemia stretching back to February 2010, the oldest lab results provided. She had an Hb 11.9, Pl 268K, and ANC 12.9 on 04/20/2008. Hb 10.5 on 09/25/2015.  In regards to a family history of anemia ,she reports her daughter had anemia years ago and had to go to [place]. She does not know/remember what it was. Her daughter is a young adult now.  At this time, she reports occasional fatigue. She takes naps when she does have fatigue. She reports occasional night sweats. She had a partial hysterectomy years ago and did not have a menstrual cycle since. Denies fever or pruritus. She reports ecchymoses on her bilateral lower extremities with none on her arms. She denies recent chest pain on exertion, shortness of breath on minimal exertion, pre-syncopal episodes, or palpitations. She had not noticed any recent bleeding such as epistaxis, hematuria or hematochezia. The patient only takes over the counter NSAIDs for muscle pain PRN. She never had a colonoscopy. She had no prior history or diagnosis of cancer. She denies any pica and eats a variety of diet. She never donated blood. She  received a blood transfusion on 04/30/16. The patient is not prescribed any oral iron supplements.  MEDICAL HISTORY:  Past Medical History:  Diagnosis Date  . Migraine     SURGICAL HISTORY: Past Surgical History:  Procedure Laterality Date  . ABDOMINAL HYSTERECTOMY    . CESAREAN SECTION      SOCIAL HISTORY: Social History   Social History  . Marital status: Divorced    Spouse name: N/A  . Number of children: N/A  . Years of education: N/A   Occupational History  . Not on file.   Social History Main Topics  . Smoking status: Never Smoker  . Smokeless tobacco: Never Used  . Alcohol use Yes     Comment: occasionally  . Drug use: No  . Sexual activity: Not on file   Other Topics Concern  . Not on file   Social History Narrative  . No narrative on file    FAMILY HISTORY: Family History  Problem Relation Age of Onset  . Stroke Father   . Anemia Daughter     ALLERGIES:  has No Known Allergies.  MEDICATIONS:  No current outpatient prescriptions on file.   No current facility-administered medications for this visit.     REVIEW OF SYSTEMS:   Constitutional: Denies fevers, chills or abnormal night sweats Eyes: Denies blurriness of vision, double vision or watery eyes Ears, nose, mouth, throat, and face: Denies mucositis or sore throat Respiratory: Denies cough, dyspnea or wheezes Cardiovascular: Denies palpitation, chest discomfort or lower extremity swelling Gastrointestinal:  Denies nausea, heartburn or change in bowel habits Skin: Denies abnormal skin rashes (+)  Ecchymoses on the bilateral lower extremities. Lymphatics: Denies new lymphadenopathy or easy bruising Neurological:Denies numbness, tingling or new weaknesses Behavioral/Psych: Mood is stable, no new changes  All other systems were reviewed with the patient and are negative.  PHYSICAL EXAMINATION: ECOG PERFORMANCE STATUS: 0 - Asymptomatic  Vitals:   05/04/16 1402  BP: 101/71  Pulse: 64    Resp: 18  Temp: 98.5 F (36.9 C)   Filed Weights   05/04/16 1402  Weight: 115 lb 6.4 oz (52.3 kg)    GENERAL:alert, no distress and comfortable, appears to be pale  SKIN: skin color, texture, turgor are normal, no rashes or significant lesions EYES: normal, conjunctiva are pink and non-injected, sclera clear OROPHARYNX:no exudate, no erythema and lips, buccal mucosa, and tongue normal  NECK: supple, thyroid normal size, non-tender, without nodularity LYMPH:  no palpable lymphadenopathy in the cervical, axillary or inguinal LUNGS: clear to auscultation and percussion with normal breathing effort HEART: regular rate & rhythm and no murmurs and no lower extremity edema ABDOMEN:abdomen soft, non-tender and normal bowel sounds Musculoskeletal:no cyanosis of digits and no clubbing. (+) Multiple small ecchymoses on her right leg. PSYCH: alert & oriented x 3 with fluent speech NEURO: no focal motor/sensory deficits  LABORATORY DATA:  I have reviewed the data as listed CBC Latest Ref Rng & Units 04/29/2016 09/25/2015 04/20/2008  WBC 4.0 - 10.5 K/uL 2.2(L) 5.6 -  Hemoglobin 12.0 - 15.0 g/dL 7.0(L) 10.5(L) 12.9  Hematocrit 36.0 - 46.0 % 19.9(L) 31.5(L) 38.0  Platelets 150 - 400 K/uL 54(L) 182 -    CMP Latest Ref Rng & Units 04/29/2016 09/25/2015 04/20/2008  Glucose 65 - 99 mg/dL 90 151(H) 116(H)  BUN 6 - 20 mg/dL 16 10 12   Creatinine 0.44 - 1.00 mg/dL 0.87 0.91 1.0  Sodium 135 - 145 mmol/L 139 136 136  Potassium 3.5 - 5.1 mmol/L 3.8 3.3(L) 3.2(L)  Chloride 101 - 111 mmol/L 107 104 101  CO2 22 - 32 mmol/L 26 24 -  Calcium 8.9 - 10.3 mg/dL 8.9 9.1 -  Total Protein 6.5 - 8.1 g/dL 6.9 7.3 -  Total Bilirubin 0.3 - 1.2 mg/dL 0.7 0.4 -  Alkaline Phos 38 - 126 U/L 65 80 -  AST 15 - 41 U/L 23 21 -  ALT 14 - 54 U/L 10(L) 14 -   Results for Morgan Waters (MRN [id number]) as of 05/04/2016 14:12  Ref. Range 04/29/2016 21:55  LDH Latest Ref Range: 98 - 192 U/L 203 (H)  Ferritin Latest Ref Range:  11 - 307 ng/mL 76  Folate Latest Ref Range: >5.9 ng/mL 7.8  Vitamin B12 Latest Ref Range: 180 - 914 pg/mL 340   Absolute reticulocyte count 34.6 Heptoglobin normal  RADIOGRAPHIC STUDIES: I have personally reviewed the radiological images as listed and agreed with the findings in the report. No results found.  ASSESSMENT & PLAN: 51 y.o. African-American female who presented to the ED for fatigue and upper and lower extremity ecchymoses  1. Pancytopenia -I reviewed lab results with the patient that were obtained from workup in the ED on 04/29/16. -Hb 7.0, ANC 0.9, Platlets 54K were below normal. Reticular count is very low, no blasts on the peripheral smear. -Iron study, folic acid and Vitamin B12 were normal. No evidence of nutritional anemia -She has markedly elevated LDH, but heptoglobin normal, reticular count low, no lab evidence of hemolysis. -She has no history of liver disease, does not drink alcohol heavily, not on any medication - she has had mild  anemiaI since 09/2015,  worsening now, and he developed neutropenia and thrombocytopenia. I suspect this is primary  bone marrow disease, especially myelodysplastic syndrome (MDS). Anaplastic anemia, acute leukemia, or lymphoma is also a possibility, but less likely given the indolent course of the disease, no consider symptom, no peripheral lymphadenopathy or splenomegaly.  -I recommend bone marrow biopsy by IR to determine to cause of her pancytopenia. The patient is in agreement. This will be scheduled asap.   PLAN -Bone marrow biopsy by IR within 1 week. -F/u on 05/12/16 or 05/13/16.  No problem-specific Assessment & Plan notes found for this encounter.    All questions were answered. The patient knows to call the clinic with any problems, questions or concerns.  I spent 40 minutes counseling the patient face to face. The total time spent in the appointment was 45 minutes and more than 50% was on counseling.     Truitt Merle,  MD 05/04/2016   This document serves as a record of services personally performed by Truitt Merle, MD. It was created on her behalf by Darcus Austin, a trained medical scribe. The creation of this record is based on the scribe's personal observations and the provider's statements to them. This document has been checked and approved by the attending provider.

## 2016-05-09 ENCOUNTER — Other Ambulatory Visit: Payer: Self-pay | Admitting: Radiology

## 2016-05-10 ENCOUNTER — Other Ambulatory Visit: Payer: Self-pay | Admitting: Hematology

## 2016-05-10 ENCOUNTER — Ambulatory Visit (HOSPITAL_COMMUNITY)
Admission: RE | Admit: 2016-05-10 | Discharge: 2016-05-10 | Disposition: A | Discharge status: Home / Self Care | Payer: Medicaid Other | Source: Ambulatory Visit | Attending: Hematology | Admitting: Hematology

## 2016-05-10 ENCOUNTER — Encounter (HOSPITAL_COMMUNITY): Payer: Self-pay

## 2016-05-10 DIAGNOSIS — D61818 Other pancytopenia: Secondary | ICD-10-CM | POA: Diagnosis present

## 2016-05-10 DIAGNOSIS — C9200 Acute myeloblastic leukemia, not having achieved remission: Secondary | ICD-10-CM | POA: Diagnosis not present

## 2016-05-10 DIAGNOSIS — Z832 Family history of diseases of the blood and blood-forming organs and certain disorders involving the immune mechanism: Secondary | ICD-10-CM | POA: Insufficient documentation

## 2016-05-10 DIAGNOSIS — Z823 Family history of stroke: Secondary | ICD-10-CM | POA: Diagnosis not present

## 2016-05-10 LAB — CBC WITH DIFFERENTIAL/PLATELET
Basophils Absolute: 0 10*3/uL (ref 0.0–0.1)
Basophils Relative: 0 %
EOS ABS: 0 10*3/uL (ref 0.0–0.7)
Eosinophils Relative: 0 %
HEMATOCRIT: 24.1 % — AB (ref 36.0–46.0)
HEMOGLOBIN: 8.6 g/dL — AB (ref 12.0–15.0)
Lymphocytes Relative: 60 %
Lymphs Abs: 1.7 10*3/uL (ref 0.7–4.0)
MCH: 33.9 pg (ref 26.0–34.0)
MCHC: 35.7 g/dL (ref 30.0–36.0)
MCV: 94.9 fL (ref 78.0–100.0)
MONOS PCT: 1 %
Monocytes Absolute: 0 10*3/uL — ABNORMAL LOW (ref 0.1–1.0)
NEUTROS ABS: 1.1 10*3/uL — AB (ref 1.7–7.7)
NEUTROS PCT: 39 %
Platelets: 51 10*3/uL — ABNORMAL LOW (ref 150–400)
RBC: 2.54 MIL/uL — ABNORMAL LOW (ref 3.87–5.11)
RDW: 15.7 % — ABNORMAL HIGH (ref 11.5–15.5)
WBC: 2.8 10*3/uL — ABNORMAL LOW (ref 4.0–10.5)

## 2016-05-10 LAB — BASIC METABOLIC PANEL
ANION GAP: 7 (ref 5–15)
BUN: 18 mg/dL (ref 6–20)
CALCIUM: 8.9 mg/dL (ref 8.9–10.3)
CO2: 23 mmol/L (ref 22–32)
Chloride: 112 mmol/L — ABNORMAL HIGH (ref 101–111)
Creatinine, Ser: 0.96 mg/dL (ref 0.44–1.00)
GFR calc non Af Amer: >60 mL/min (ref >60)
Glucose, Bld: 103 mg/dL — ABNORMAL HIGH (ref 65–99)
Potassium: 3.9 mmol/L (ref 3.5–5.1)
Sodium: 142 mmol/L (ref 135–145)

## 2016-05-10 LAB — PROTIME-INR
INR: 0.98
PROTHROMBIN TIME: 13 s (ref 11.4–15.2)

## 2016-05-10 MED ORDER — LIDOCAINE HCL (PF) 1 % IJ SOLN
INTRAMUSCULAR | Status: AC | PRN
  Administered 2016-05-10: 30 mL

## 2016-05-10 MED ORDER — MIDAZOLAM HCL 2 MG/2ML IJ SOLN
INTRAMUSCULAR | Status: AC
  Filled 2016-05-10: qty 6

## 2016-05-10 MED ORDER — SODIUM CHLORIDE 0.9 % IV SOLN
INTRAVENOUS | Status: DC
  Administered 2016-05-10: 07:00:00 via INTRAVENOUS

## 2016-05-10 MED ORDER — FENTANYL CITRATE (PF) 100 MCG/2ML IJ SOLN
INTRAMUSCULAR | Status: AC
  Filled 2016-05-10: qty 4

## 2016-05-10 MED ORDER — MIDAZOLAM HCL 2 MG/2ML IJ SOLN
INTRAMUSCULAR | Status: AC | PRN
  Administered 2016-05-10 (×2): 1 mg via INTRAVENOUS

## 2016-05-10 MED ORDER — FENTANYL CITRATE (PF) 100 MCG/2ML IJ SOLN
INTRAMUSCULAR | Status: AC | PRN
  Administered 2016-05-10: 50 ug via INTRAVENOUS

## 2016-05-10 NOTE — Procedures (Signed)
CT-guided  R iliac bone marrow aspiration and core biopsy No complication No blood loss. See complete dictation in [facility] PACS  

## 2016-05-10 NOTE — Consult Note (Signed)
Chief Complaint: Patient was seen in consultation today for CT-guided bone marrow biopsy  Referring Physician(s): Feng,Yan  Supervising Physician: Arne Cleveland  Patient Status: [facility] - Out-pt  History of Present Illness: Morgan Waters is a 51 y.o. female with history of fatigue, lower extremity ecchymoses, and pancytopenia who presents today for CT-guided bone marrow biopsy for further evaluation.  Past Medical History:  Diagnosis Date  . Migraine     Past Surgical History:  Procedure Laterality Date  . ABDOMINAL HYSTERECTOMY    . CESAREAN SECTION      Allergies: Patient has no known allergies.  Medications: Prior to Admission medications   Medication Sig Start Date End Date Taking? Authorizing Provider  ibuprofen (ADVIL,MOTRIN) 200 MG tablet Take 200 mg by mouth every 6 (six) hours as needed.   Yes Historical Provider, MD     Family History  Problem Relation Age of Onset  . Stroke Father   . Anemia Daughter     Social History   Social History  . Marital status: Divorced    Spouse name: N/A  . Number of children: N/A  . Years of education: N/A   Social History Main Topics  . Smoking status: Never Smoker  . Smokeless tobacco: Never Used  . Alcohol use Yes     Comment: occasionally  . Drug use: No  . Sexual activity: Not Asked   Other Topics Concern  . None   Social History Narrative  . None      Review of Systems see above; denies fever, headache, chest pain, dyspnea, cough, abdominal/back pain, nausea, vomiting or abnormal bleeding.  Vital Signs: BP 128/82 (BP Location: Right Arm)   Pulse 77   Temp 98.2 F (36.8 C) (Oral)   Resp 16   SpO2 100%   Physical Exam awake, alert. Chest clear to auscultation bilaterally. Heart with regular rate and rhythm. Abdomen soft, positive bowel sounds, nontender. Lower extremities with ecchymoses but no significant edema.  Mallampati Score:     Imaging: No results  found.  Labs:  CBC:  Recent Labs  09/25/15 2121 04/29/16 1947 05/10/16 0656  WBC 5.6 2.2* 2.8*  HGB 10.5* 7.0* 8.6*  HCT 31.5* 19.9* 24.1*  PLT 182 54* 51*    COAGS:  Recent Labs  05/10/16 0656  INR 0.98    BMP:  Recent Labs  09/25/15 2121 04/29/16 1947 05/10/16 0656  NA 136 139 142  K 3.3* 3.8 3.9  CL 104 107 112*  CO2 24 26 23   GLUCOSE 151* 90 103*  BUN 10 16 18   CALCIUM 9.1 8.9 8.9  CREATININE 0.91 0.87 0.96  GFRNONAA >60 >60 >60  GFRAA >60 >60 >60    LIVER FUNCTION TESTS:  Recent Labs  09/25/15 2121 04/29/16 2155  BILITOT 0.4 0.7  AST 21 23  ALT 14 10*  ALKPHOS 80 65  PROT 7.3 6.9  ALBUMIN 3.7 3.6    TUMOR MARKERS: No results for input(s): AFPTM, CEA, CA199, CHROMGRNA in the last 8760 hours.  Assessment and Plan: Patient with history of fatigue, lower extremity ecchymoses and pancytopenia; presents today for CT-guided bone marrow biopsy for further evaluation.Risks and benefits discussed with the patient including, but not limited to bleeding, infection, damage to adjacent structures or low yield requiring additional tests.All of the patient's questions were answered, patient is agreeable to proceed.Consent signed and in chart.     Thank you for this interesting consult.  I greatly enjoyed meeting Eara N Ambrocio and look  forward to participating in their care.  A copy of this report was sent to the requesting provider on this date.  Electronically Signed: D. Rowe Robert 05/10/2016, 8:19 AM   I spent a total of  20 minutes   in face to face in clinical consultation, greater than 50% of which was counseling/coordinating care for CT-guided bone marrow biopsy

## 2016-05-10 NOTE — Discharge Instructions (Signed)
Bone Marrow Aspiration and Bone Marrow Biopsy, Adult, Care After °This sheet gives you information about how to care for yourself after your procedure. Your health care provider may also give you more specific instructions. If you have problems or questions, contact your health care provider. °What can I expect after the procedure? °After the procedure, it is common to have: °· Mild pain and tenderness. °· Swelling. °· Bruising. °Follow these instructions at home: °· Take over-the-counter or prescription medicines only as told by your health care provider. °· Do not take baths, swim, or use a hot tub until your health care provider approves. Ask if you can take a shower or have a sponge bath. °· Follow instructions from your health care provider about how to take care of the puncture site. Make sure you: °¨ Wash your hands with soap and water before you change your bandage (dressing). If soap and water are not available, use hand sanitizer. °¨ Change your dressing as told by your health care provider. °· Check your puncture site every day for signs of infection. Check for: °¨ More redness, swelling, or pain. °¨ More fluid or blood. °¨ Warmth. °¨ Pus or a bad smell. °· Return to your normal activities as told by your health care provider. Ask your health care provider what activities are safe for you. °· Do not drive for 24 hours if you were given a medicine to help you relax (sedative). °· Keep all follow-up visits as told by your health care provider. This is important. °Contact a health care provider if: °· You have more redness, swelling, or pain around the puncture site. °· You have more fluid or blood coming from the puncture site. °· Your puncture site feels warm to the touch. °· You have pus or a bad smell coming from the puncture site. °· You have a fever. °· Your pain is not controlled with medicine. °This information is not intended to replace advice given to you by your health care provider. Make sure you  discuss any questions you have with your health care provider. °Document Released: 08/27/2004 Document Revised: 08/28/2015 Document Reviewed: 07/22/2015 °Elsevier Interactive Patient Education © 2017 [employer] °Moderate Conscious Sedation, Adult, Care After °These instructions provide you with information about caring for yourself after your procedure. Your health care provider may also give you more specific instructions. Your treatment has been planned according to current medical practices, but problems sometimes occur. Call your health care provider if you have any problems or questions after your procedure. °What can I expect after the procedure? °After your procedure, it is common: °· To feel sleepy for several hours. °· To feel clumsy and have poor balance for several hours. °· To have poor judgment for several hours. °· To vomit if you eat too soon. °Follow these instructions at home: °For at least 24 hours after the procedure:  ° °· Do not: °¨ Participate in activities where you could fall or become injured. °¨ Drive. °¨ Use heavy machinery. °¨ Drink alcohol. °¨ Take sleeping pills or medicines that cause drowsiness. °¨ Make important decisions or sign legal documents. °¨ Take care of children on your own. °· Rest. °Eating and drinking  °· Follow the diet recommended by your health care provider. °· If you vomit: °¨ Drink water, juice, or soup when you can drink without vomiting. °¨ Make sure you have little or no nausea before eating solid foods. °General instructions  °· Have a responsible adult stay with you until you   are awake and alert. °· Take over-the-counter and prescription medicines only as told by your health care provider. °· If you smoke, do not smoke without supervision. °· Keep all follow-up visits as told by your health care provider. This is important. °Contact a health care provider if: °· You keep feeling nauseous or you keep vomiting. °· You feel light-headed. °· You develop a  rash. °· You have a fever. °Get help right away if: °· You have trouble breathing. °This information is not intended to replace advice given to you by your health care provider. Make sure you discuss any questions you have with your health care provider. °Document Released: 11/28/2012 Document Revised: 07/13/2015 Document Reviewed: 05/30/2015 °Elsevier Interactive Patient Education © 2017 [employer] ° °

## 2016-05-12 ENCOUNTER — Telehealth: Payer: Self-pay | Admitting: *Deleted

## 2016-05-12 NOTE — Telephone Encounter (Signed)
Called pt & asked if she could come in at 8 am tomorrow instead of in the afternoon & she agreed.  Dr Burr Medico sent LOS to change schedule.  May get labs after MD visit.

## 2016-05-13 ENCOUNTER — Telehealth: Payer: Self-pay | Admitting: Hematology

## 2016-05-13 ENCOUNTER — Ambulatory Visit (HOSPITAL_BASED_OUTPATIENT_CLINIC_OR_DEPARTMENT_OTHER): Payer: Self-pay | Admitting: Hematology

## 2016-05-13 ENCOUNTER — Encounter: Payer: Self-pay | Admitting: Hematology

## 2016-05-13 DIAGNOSIS — C9100 Acute lymphoblastic leukemia not having achieved remission: Secondary | ICD-10-CM

## 2016-05-13 DIAGNOSIS — D61818 Other pancytopenia: Secondary | ICD-10-CM

## 2016-05-13 DIAGNOSIS — C9200 Acute myeloblastic leukemia, not having achieved remission: Secondary | ICD-10-CM | POA: Insufficient documentation

## 2016-05-13 NOTE — Progress Notes (Signed)
[place]  [phone] Fax:([phone]) [phone]  Clinic Follow Up Note   Patient Care Team: No Pcp Per Patient as PCP - General (General Practice) 05/13/2016   CHIEF COMPLAINTS:  Acute Lymphoid Leukemia  HISTORY OF PRESENTING ILLNESS:  Morgan Waters 51 y.o. female who presented to the ED on 04/29/16 for fatigue and diffuse intermittent bruising to her bilateral legs and right forearm that started 1 month prior. CBC revealed Hb 7.0, low WBC, RBC, HCT, and elevated RDW. Platelets were low at 54K. Blood smear revealed pancytopenia including neutropenia. 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 I was subsequently contacted and I requested 1 unit of blood, iron work up, and a follow up.  The patient has a history of anemia stretching back to February 2010, the oldest lab results provided. She had an Hb 11.9, Pl 268K, and ANC 12.9 on 04/20/2008. Hb 10.5 on 09/25/2015.  In regards to a family history of anemia ,she reports her daughter had anemia years ago and had to go to [place]. She does not know/remember what it was. Her daughter is a young adult now.  At this time, she reports occasional fatigue. She takes naps when she does have fatigue. She reports occasional night sweats. She had a partial hysterectomy years ago and did not have a menstrual cycle since. Denies fever or pruritus. She reports ecchymoses on her bilateral lower extremities with none on her arms. She denies recent chest pain on exertion, shortness of breath on minimal exertion, pre-syncopal episodes, or palpitations. She had not noticed any recent bleeding such as epistaxis, hematuria or hematochezia. The patient only takes over the counter NSAIDs for muscle pain PRN. She never had a colonoscopy. She had no prior history or diagnosis of cancer. She denies any pica and eats a variety of diet. She never donated blood. She received a blood transfusion on 04/30/16. The patient is not  prescribed any oral iron supplements.  INTERVAL HISTORY: Morgan Waters returns for a follow up. She is accompanied by her grandson and daughter. The patient reports no complications from her bone biopsy from 05/10/16. She denies fevers or chills. 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  MEDICAL HISTORY:  Past Medical History:  Diagnosis Date  . Migraine     SURGICAL HISTORY: Past Surgical History:  Procedure Laterality Date  . ABDOMINAL HYSTERECTOMY    . CESAREAN SECTION      SOCIAL HISTORY: Social History   Social History  . Marital status: Divorced    Spouse name: N/A  . Number of children: N/A  . Years of education: N/A   Occupational History  . Not on file.   Social History Main Topics  . Smoking status: Never Smoker  . Smokeless tobacco: Never Used  . Alcohol use Yes     Comment: occasionally  . Drug use: No  . Sexual activity: Not on file   Other Topics Concern  . Not on file   Social History Narrative  . No narrative on file    FAMILY HISTORY: Family History  Problem Relation Age of Onset  . Stroke Father   . Anemia Daughter     ALLERGIES:  has No Known Allergies.  MEDICATIONS:  Current Outpatient Prescriptions  Medication Sig Dispense Refill  . ibuprofen (ADVIL,MOTRIN) 200 MG tablet Take 200 mg by mouth every 6 (six) hours as needed.     No current facility-administered medications for this visit.     REVIEW OF  SYSTEMS:   Constitutional: Denies fevers, chills or abnormal night sweats Eyes: Denies blurriness of vision, double vision or watery eyes Ears, nose, mouth, throat, and face: Denies mucositis or sore throat Respiratory: Denies cough, dyspnea or wheezes Cardiovascular: Denies palpitation, chest discomfort or lower extremity swelling Gastrointestinal:  Denies nausea, heartburn or change in bowel habits Skin: Denies abnormal skin rashes (+) Ecchymoses on the bilateral lower  extremities. Lymphatics: Denies new lymphadenopathy or easy bruising Neurological:Denies numbness, tingling or new weaknesses Behavioral/Psych: Mood is stable, no new changes  All other systems were reviewed with the patient and are negative.  PHYSICAL EXAMINATION: ECOG PERFORMANCE STATUS: 0 - Asymptomatic  Vitals:   05/13/16 0813  BP: 120/81  Pulse: 68  Resp: 18  Temp: 98.1 F (36.7 C)   Filed Weights   05/13/16 0813  Weight: 116 lb 11.2 oz (52.9 kg)    GENERAL:alert, no distress and comfortable, appears to be pale  SKIN: skin color, texture, turgor are normal, no rashes or significant lesions EYES: normal, conjunctiva are pink and non-injected, sclera clear OROPHARYNX:no exudate, no erythema and lips, buccal mucosa, and tongue normal  NECK: supple, thyroid normal size, non-tender, without nodularity LYMPH:  no palpable lymphadenopathy in the cervical, axillary or inguinal LUNGS: clear to auscultation and percussion with normal breathing effort HEART: regular rate & rhythm and no murmurs and no lower extremity edema ABDOMEN:abdomen soft, non-tender and normal bowel sounds Musculoskeletal:no cyanosis of digits and no clubbing. (+) Multiple small ecchymoses on her right leg. PSYCH: alert & oriented x 3 with fluent speech NEURO: no focal motor/sensory deficits  LABORATORY DATA:  I have reviewed the data as listed CBC Latest Ref Rng & Units 05/10/2016 04/29/2016 09/25/2015  WBC 4.0 - 10.5 K/uL 2.8(L) 2.2(L) 5.6  Hemoglobin 12.0 - 15.0 g/dL 8.6(L) 7.0(L) 10.5(L)  Hematocrit 36.0 - 46.0 % 24.1(L) 19.9(L) 31.5(L)  Platelets 150 - 400 K/uL 51(L) 54(L) 182    CMP Latest Ref Rng & Units 05/10/2016 04/29/2016 09/25/2015  Glucose 65 - 99 mg/dL 103(H) 90 151(H)  BUN 6 - 20 mg/dL _0 Creatinine 0.44 - 1.00 mg/dL 0.96 0.87 0.91  Sodium 135 - 145 mmol/L 142 139 136  Potassium 3.5 - 5.1 mmol/L 3.9 3.8 3.3(L)  Chloride 101 - 111 mmol/L 112(H) 107 104  CO2 22 - 32 mmol/L _1 Calcium 8.9 - 10.3 mg/dL 8.9 8.9 9.1  Total Protein 6.5 - 8.1 g/dL - 6.9 7.3  Total Bilirubin 0.3 - 1.2 mg/dL - 0.7 0.4  Alkaline Phos 38 - 126 U/L - 65 80  AST 15 - 41 U/L - 23 21  ALT 14 - 54 U/L - 10(L) 14   Results for Morgan Waters (MRN [id number]) as of 05/04/2016 14:12  Ref. Range 04/29/2016 21:55  LDH Latest Ref Range: 98 - 192 U/L 203 (H)  Ferritin Latest Ref Range: 11 - 307 ng/mL 76  Folate Latest Ref Range: >5.9 ng/mL 7.8  Vitamin B12 Latest Ref Range: 180 - 914 pg/mL 340   Absolute reticulocyte count 34.6 Heptoglobin normal  PATHOLOGY Diagnosis 05/10/16 Bone Marrow, Aspirate,Biopsy, and Clot, right iliac - ACUTE MYELOID LEUKEMIA. - SEE COMMENT. PERIPHERAL BLOOD: - PANCYTOPENIA.  Interpretation 05/10/16 Bone Marrow Flow Cytometry - ACUTE MYELOID LEUKEMIA, SEE COMMENT.  PERIPHERAL BLOOD: - PANCYTOPENIA. Diagnosis Note The marrow is mildly hypercellular with increased blasts (25% aspirate, 37% flow cytometry, 30% CD34). There is background dysplasia. Overall, the findings are consistent with acute myeloid leukemia. Dr. Burr Medico was  notified on 05/11/2016.  RADIOGRAPHIC STUDIES: I have personally reviewed the radiological images as listed and agreed with the findings in the report. Ct Biopsy  Result Date: 05/10/2016 CLINICAL DATA:  fatigue, lower extremity ecchymoses, and pancytopenia who presents today for CT-guided bone marrow biopsy for further evaluation. EXAM: CT GUIDED DEEP ILIAC BONE ASPIRATION AND CORE BIOPSY TECHNIQUE: The procedure, risks (including but not limited to bleeding, infection, organ damage ), benefits, and alternatives were explained to the patient. Questions regarding the procedure were encouraged and answered. The patient understands and consents to the procedure. Patient was placed supine on the CT gantry and limited axial scans through the pelvis were obtained. Appropriate skin entry site was identified. Skin site was marked, prepped with  chlorhexidine, draped in usual sterile fashion, and infiltrated locally with 1% lidocaine. Intravenous Fentanyl and Versed were administered as conscious sedation during continuous monitoring of the patient's level of consciousness and physiological / cardiorespiratory status by the radiology RN, with a total moderate sedation time of 8 minutes. Under CT fluoroscopic guidance an 11-gauge Cook trocar bone needle was advanced into the right iliac bone just lateral to the sacroiliac joint. Once needle tip position was confirmed, coaxial core and aspiration samples were obtained. The final sample was obtained using the guiding needle itself, which was then removed. Post procedure scans show no hematoma or fracture. Patient tolerated procedure well. COMPLICATIONS: COMPLICATIONS none IMPRESSION: 1. Technically successful CT guided right iliac bone core and aspiration biopsy. Electronically Signed   By: Lucrezia Europe M.D.   On: 05/10/2016 12:39   Ct Bone Marrow Biopsy & Aspiration  Result Date: 05/10/2016 CLINICAL DATA:  fatigue, lower extremity ecchymoses, and pancytopenia who presents today for CT-guided bone marrow biopsy for further evaluation. EXAM: CT GUIDED DEEP ILIAC BONE ASPIRATION AND CORE BIOPSY TECHNIQUE: The procedure, risks (including but not limited to bleeding, infection, organ damage ), benefits, and alternatives were explained to the patient. Questions regarding the procedure were encouraged and answered. The patient understands and consents to the procedure. Patient was placed supine on the CT gantry and limited axial scans through the pelvis were obtained. Appropriate skin entry site was identified. Skin site was marked, prepped with chlorhexidine, draped in usual sterile fashion, and infiltrated locally with 1% lidocaine. Intravenous Fentanyl and Versed were administered as conscious sedation during continuous monitoring of the patient's level of consciousness and physiological / cardiorespiratory  status by the radiology RN, with a total moderate sedation time of 8 minutes. Under CT fluoroscopic guidance an 11-gauge Cook trocar bone needle was advanced into the right iliac bone just lateral to the sacroiliac joint. Once needle tip position was confirmed, coaxial core and aspiration samples were obtained. The final sample was obtained using the guiding needle itself, which was then removed. Post procedure scans show no hematoma or fracture. Patient tolerated procedure well. COMPLICATIONS: COMPLICATIONS none IMPRESSION: 1. Technically successful CT guided right iliac bone core and aspiration biopsy. Electronically Signed   By: Lucrezia Europe M.D.   On: 05/10/2016 12:39    ASSESSMENT & PLAN: 51 y.o. African-American female who presented to the ED for fatigue and upper and lower extremity ecchymoses  1. Acute Lymphoid Leukemia, cytogenetics pending  -I reviewed lab results with the patient that were obtained from workup in the ED on 04/29/16. -Hb 7.0, ANC 0.9, Platlets 54K were below normal. Reticular count is very low, no blasts on the peripheral smear. -Iron study, folic acid and Vitamin B12 were normal. No evidence of nutritional anemia -She  has markedly elevated LDH, but heptoglobin normal, reticular count low, no lab evidence of hemolysis. -She has no history of liver disease, does not drink alcohol heavily, not on any medication - she has had mild anemiaI since 09/2015,  worsening now, and he developed neutropenia and thrombocytopenia.  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 Bone marrow flow cytometry also revealed acute myeloid leukemia. -I discussed the biopsy results with the patient and her family in detail. We discussed the aggressive natural history of acute myeloid leukemia, and urgency of needing treatment as soon as possible. 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 -We discussed that that she is likely going to have bone marrow transplant when her leukemia gets into remission after induction chemotherapy. -I have made an urgent referral to [place] leukemia program, due to her lack of insurance, she need financial clearance before appointment can be scheduled.   2. Pancytopenia -Secondary of acute myeloid leukemia. -The patient had a blood transfusion on 04/30/16 in the ED and her counts improved.  -CBC overall stable, no recent infection or significant bleeding   3. Financial Issues -The patient is not working at this time and she does not have insurance. -I referred her to see our Development worker, community, and will referral to social services to see if she is a candidate for Medicaid.  PLAN -Referral to social work and financial service today -urgent referral to [facility] program, they will schedule her consult  their financial office clear her -Lab today to check uric acid, LDH and CMP. -I will follow up   No problem-specific Assessment & Plan notes found for this encounter.    All questions were answered. The patient knows to call the clinic with any problems, questions or concerns.  I spent 30 minutes counseling the patient face to face. The total time spent in the appointment was 35 minutes and more than 50% was on counseling.     Truitt Merle, MD 05/13/2016   This document serves as a record of services personally performed by Truitt Merle, MD. It was created on her behalf by Darcus Austin, a trained medical scribe. The creation of this record is based on the scribe's personal observations and the provider's statements to them. This document has been checked and approved by the attending provider.

## 2016-05-13 NOTE — Progress Notes (Signed)
Pt came in to discuss financial assistance.  After reviewing her chart there's no treatment plan yet.  Once treatment plan has been established I will reach out to pt to discuss financial options.  I gave her a Medicaid application and advised she turn it in to the DSS in her county.  She verbalized understanding.

## 2016-05-13 NOTE — Telephone Encounter (Signed)
Faxed records to Dr Florene Glen  @ [place] [phone]

## 2016-05-16 ENCOUNTER — Ambulatory Visit (HOSPITAL_COMMUNITY): Payer: Self-pay

## 2016-05-17 LAB — CHROMOSOME ANALYSIS, BONE MARROW

## 2016-05-20 ENCOUNTER — Encounter: Payer: Self-pay | Admitting: *Deleted

## 2016-05-20 ENCOUNTER — Telehealth: Payer: Self-pay | Admitting: Hematology

## 2016-05-20 NOTE — Progress Notes (Signed)
[place] Work  Clinical Social Work was referred by Futures trader for assessment of psychosocial needs due to new diagnosis of MDS.  Clinical Social Worker attempted to contact patient at home to offer support and assess for needs.  CSW introduced self and explained role of CSW/Pt and Family Support Team. Pt does not have follow up appointments scheduled here as will be going to [facility] for care. CSW directed pt in message to follow up with financial counselors there for additional guidance. Qualification for SSD may depend on treatment plan and other variables that have yet to be determined. CSW left contact info if pt would like to return call.    Loren Racer, LCSW, OSW-C Clinical Social Worker [place]  [place] Phone: [phone] Fax: [phone]

## 2016-05-20 NOTE — Telephone Encounter (Signed)
Left message re 4/4 lab. Patient was to return to lab after 3/23 visit. No other orders per 3/23 los.

## 2016-05-25 ENCOUNTER — Other Ambulatory Visit: Payer: Self-pay

## 2016-07-12 ENCOUNTER — Telehealth: Payer: Self-pay | Admitting: *Deleted

## 2016-07-12 NOTE — Telephone Encounter (Signed)
Spoke with pt and was informed that pt will be back at [facility] on  07/19/16 for 3 days admission.  Asked if pt feels needing lab and transfusion before going back to [facility], pt stated " NO, I feel ok ".   Stated eating and drinking fine, only gets tired walking down the stairs, trying to get back to her normal ADLs as tolerated. Instructed pt to call office back if pt feels needing supportive measures before 07/19/16.   Pt voiced understanding.

## 2016-07-19 ENCOUNTER — Encounter (INDEPENDENT_AMBULATORY_CARE_PROVIDER_SITE_OTHER): Payer: Self-pay | Admitting: Physician Assistant

## 2016-07-19 ENCOUNTER — Ambulatory Visit (INDEPENDENT_AMBULATORY_CARE_PROVIDER_SITE_OTHER): Payer: Medicaid Other | Admitting: Physician Assistant

## 2016-07-19 VITALS — BP 98/65 | HR 73 | Temp 97.8°F | Resp 18 | Ht 59.0 in | Wt 109.0 lb

## 2016-07-19 DIAGNOSIS — C9501 Acute leukemia of unspecified cell type, in remission: Secondary | ICD-10-CM

## 2016-07-19 NOTE — Progress Notes (Signed)
Patient is here to establish care for history of leukemia  Patient denies pain at this time.  Patient has not taken medication today. Patient as not eaten today.

## 2016-07-19 NOTE — Progress Notes (Signed)
  Subjective:  Patient ID: Morgan Waters, female    DOB: March 27, 1965  Age: 51 y.o. MRN: [id number]  CC: leukemia  HPI Morgan Waters is a 51 y.o. female with leukemia presents for referral to oncology. Dr Jerrye Noble at [facility] Hematology and Oncology department called last week stating he would need for a PCP referral in order for patient to be seen again at [facility]. He said patient is improving but in need for further treatment. Pt confirms this information is correct. Last saw oncology two weeks ago. She was supposed to resume f/u treatment with oncology today but due to referral need, she is here today. Says she has been rescheduled for oncology OV on July 22, 2016. States she is asymptomatic, has no complaints.    ROS Review of Systems  Constitutional: Negative for chills, fever and malaise/fatigue.  Eyes: Negative for blurred vision.  Respiratory: Negative for shortness of breath.   Cardiovascular: Negative for chest pain and palpitations.  Gastrointestinal: Negative for abdominal pain and nausea.  Genitourinary: Negative for dysuria and hematuria.  Musculoskeletal: Negative for joint pain and myalgias.  Skin: Negative for rash.  Neurological: Negative for tingling and headaches.  Psychiatric/Behavioral: Negative for depression. The patient is not nervous/anxious.     Objective:  BP 98/65 (BP Location: Left Arm, Patient Position: Sitting, Cuff Size: Normal)   Pulse 73   Temp 97.8 F (36.6 C) (Oral)   Resp 18   Ht 4\' 11"  (1.499 m)   Wt 109 lb (49.4 kg)   SpO2 100%   BMI 22.02 kg/m   BP/Weight 07/19/2016 05/13/2016 5/32/9924  Systolic BP 98 268 98  Diastolic BP 65 81 66  Wt. (Lbs) 109 116.7 -  BMI 22.02 22.05 -      Physical Exam  Constitutional: She is oriented to person, place, and time.  Well developed, well nourished, NAD, polite  HENT:  Head: Normocephalic and atraumatic.  Eyes: No scleral icterus.  Neck: Normal range of motion.  Neck supple. No thyromegaly present.  Cardiovascular: Normal rate, regular rhythm and normal heart sounds.   Pulmonary/Chest: Effort normal and breath sounds normal.  Musculoskeletal: She exhibits no edema.  Lymphadenopathy:    She has no cervical adenopathy.  Neurological: She is alert and oriented to person, place, and time. No cranial nerve deficit. Coordination normal.  Skin: Skin is warm and dry. No rash noted. No erythema. No pallor.  Psychiatric: She has a normal mood and affect. Her behavior is normal. Thought content normal.  Vitals reviewed.    Assessment & Plan:   1. Acute leukemia in remission [facility]) - Ambulatory referral to Hematology / Oncology     Follow-up: Return if symptoms worsen or fail to improve.   Clent Demark PA

## 2016-07-27 ENCOUNTER — Ambulatory Visit (INDEPENDENT_AMBULATORY_CARE_PROVIDER_SITE_OTHER): Payer: Medicaid Other | Admitting: Physician Assistant

## 2016-07-27 ENCOUNTER — Telehealth: Payer: Self-pay | Admitting: Hematology

## 2016-07-27 ENCOUNTER — Other Ambulatory Visit: Payer: Self-pay | Admitting: Hematology

## 2016-07-27 DIAGNOSIS — C9200 Acute myeloblastic leukemia, not having achieved remission: Secondary | ICD-10-CM

## 2016-07-27 NOTE — Telephone Encounter (Signed)
Darliss Cheney from [facility] caaaled to sch lab/inj appt for pt on 6/11 due to pt being discharged from hospital . Gave christy fax number for our lab to send orders

## 2016-07-27 NOTE — Telephone Encounter (Signed)
Spoke with patient re next appointment for 6/11. Patient will get new schedule at 6/11 visit and was transferred to desk nurse re questions about injection.

## 2016-07-29 ENCOUNTER — Other Ambulatory Visit: Payer: Self-pay | Admitting: Hematology

## 2016-08-01 ENCOUNTER — Ambulatory Visit (HOSPITAL_BASED_OUTPATIENT_CLINIC_OR_DEPARTMENT_OTHER): Payer: Medicaid Other

## 2016-08-01 ENCOUNTER — Ambulatory Visit: Payer: Medicaid Other | Admitting: Hematology

## 2016-08-01 ENCOUNTER — Ambulatory Visit: Payer: Medicaid Other

## 2016-08-01 ENCOUNTER — Other Ambulatory Visit (HOSPITAL_BASED_OUTPATIENT_CLINIC_OR_DEPARTMENT_OTHER): Payer: Medicaid Other

## 2016-08-01 ENCOUNTER — Other Ambulatory Visit: Payer: Medicaid Other

## 2016-08-01 ENCOUNTER — Telehealth: Payer: Self-pay | Admitting: Hematology

## 2016-08-01 VITALS — BP 132/88 | HR 76 | Temp 98.7°F | Resp 18

## 2016-08-01 DIAGNOSIS — C9200 Acute myeloblastic leukemia, not having achieved remission: Secondary | ICD-10-CM

## 2016-08-01 DIAGNOSIS — C9100 Acute lymphoblastic leukemia not having achieved remission: Secondary | ICD-10-CM

## 2016-08-01 DIAGNOSIS — Z5189 Encounter for other specified aftercare: Secondary | ICD-10-CM

## 2016-08-01 LAB — COMPREHENSIVE METABOLIC PANEL
ALT: 20 U/L (ref 0–55)
ANION GAP: 8 meq/L (ref 3–11)
AST: 24 U/L (ref 5–34)
Albumin: 3.7 g/dL (ref 3.5–5.0)
Alkaline Phosphatase: 69 U/L (ref 40–150)
BUN: 15 mg/dL (ref 7.0–26.0)
CHLORIDE: 103 meq/L (ref 98–109)
CO2: 27 meq/L (ref 22–29)
CREATININE: 0.8 mg/dL (ref 0.6–1.1)
Calcium: 9.7 mg/dL (ref 8.4–10.4)
EGFR: >90 mL/min/{1.73_m2} (ref >90)
Glucose: 83 mg/dl (ref 70–140)
POTASSIUM: 3.9 meq/L (ref 3.5–5.1)
Sodium: 138 mEq/L (ref 136–145)
Total Bilirubin: 1.44 mg/dL — ABNORMAL HIGH (ref 0.20–1.20)
Total Protein: 7.3 g/dL (ref 6.4–8.3)

## 2016-08-01 LAB — CBC WITH DIFFERENTIAL/PLATELET
BASO%: 0.8 % (ref 0.0–2.0)
Basophils Absolute: 0 10*3/uL (ref 0.0–0.1)
EOS%: 0.8 % (ref 0.0–7.0)
Eosinophils Absolute: 0 10*3/uL (ref 0.0–0.5)
HCT: 27.5 % — ABNORMAL LOW (ref 34.8–46.6)
HGB: 9.2 g/dL — ABNORMAL LOW (ref 11.6–15.9)
LYMPH%: 14.1 % (ref 14.0–49.7)
MCH: 30.7 pg (ref 25.1–34.0)
MCHC: 33.5 g/dL (ref 31.5–36.0)
MCV: 91.7 fL (ref 79.5–101.0)
MONO#: 0 10*3/uL — ABNORMAL LOW (ref 0.1–0.9)
MONO%: 0 % (ref 0.0–14.0)
NEUT#: 2.2 10*3/uL (ref 1.5–6.5)
NEUT%: 84.3 % — AB (ref 38.4–76.8)
PLATELETS: 89 10*3/uL — AB (ref 145–400)
RBC: 3 10*6/uL — AB (ref 3.70–5.45)
RDW: 15.7 % — ABNORMAL HIGH (ref 11.2–14.5)
WBC: 2.6 10*3/uL — ABNORMAL LOW (ref 3.9–10.3)
lymph#: 0.4 10*3/uL — ABNORMAL LOW (ref 0.9–3.3)

## 2016-08-01 LAB — MAGNESIUM: Magnesium: 2 mg/dl (ref 1.5–2.5)

## 2016-08-01 LAB — LACTATE DEHYDROGENASE: LDH: 230 U/L (ref 125–245)

## 2016-08-01 LAB — URIC ACID: URIC ACID, SERUM: 5 mg/dL (ref 2.6–7.4)

## 2016-08-01 MED ORDER — PEGFILGRASTIM INJECTION 6 MG/0.6ML ~~LOC~~
6.0000 mg | PREFILLED_SYRINGE | Freq: Once | SUBCUTANEOUS | Status: AC
  Administered 2016-08-01: 6 mg via SUBCUTANEOUS
  Filled 2016-08-01: qty 0.6

## 2016-08-01 NOTE — Patient Instructions (Signed)
Pegfilgrastim injection What is this medicine? PEGFILGRASTIM (PEG fil gra stim) is a long-acting granulocyte colony-stimulating factor that stimulates the growth of neutrophils, a type of white blood cell important in the body's fight against infection. It is used to reduce the incidence of fever and infection in patients with certain types of cancer who are receiving chemotherapy that affects the bone marrow, and to increase survival after being exposed to high doses of radiation. This medicine may be used for other purposes; ask your health care provider or pharmacist if you have questions. COMMON BRAND NAME(S): Neulasta What should I tell my health care provider before I take this medicine? They need to know if you have any of these conditions: -kidney disease -latex allergy -ongoing radiation therapy -sickle cell disease -skin reactions to acrylic adhesives (On-Body Injector only) -an unusual or allergic reaction to pegfilgrastim, filgrastim, other medicines, foods, dyes, or preservatives -pregnant or trying to get pregnant -breast-feeding How should I use this medicine? This medicine is for injection under the skin. If you get this medicine at home, you will be taught how to prepare and give the pre-filled syringe or how to use the On-body Injector. Refer to the patient Instructions for Use for detailed instructions. Use exactly as directed. Tell your healthcare provider immediately if you suspect that the On-body Injector may not have performed as intended or if you suspect the use of the On-body Injector resulted in a missed or partial dose. It is important that you put your used needles and syringes in a special sharps container. Do not put them in a trash can. If you do not have a sharps container, call your pharmacist or healthcare provider to get one. Talk to your pediatrician regarding the use of this medicine in children. While this drug may be prescribed for selected conditions,  precautions do apply. Overdosage: If you think you have taken too much of this medicine contact a poison control center or emergency room at once. NOTE: This medicine is only for you. Do not share this medicine with others. What if I miss a dose? It is important not to miss your dose. Call your doctor or health care professional if you miss your dose. If you miss a dose due to an On-body Injector failure or leakage, a new dose should be administered as soon as possible using a single prefilled syringe for manual use. What may interact with this medicine? Interactions have not been studied. Give your health care provider a list of all the medicines, herbs, non-prescription drugs, or dietary supplements you use. Also tell them if you smoke, drink alcohol, or use illegal drugs. Some items may interact with your medicine. This list may not describe all possible interactions. Give your health care provider a list of all the medicines, herbs, non-prescription drugs, or dietary supplements you use. Also tell them if you smoke, drink alcohol, or use illegal drugs. Some items may interact with your medicine. What should I watch for while using this medicine? You may need blood work done while you are taking this medicine. If you are going to need a MRI, CT scan, or other procedure, tell your doctor that you are using this medicine (On-Body Injector only). What side effects may I notice from receiving this medicine? Side effects that you should report to your doctor or health care professional as soon as possible: -allergic reactions like skin rash, itching or hives, swelling of the face, lips, or tongue -dizziness -fever -pain, redness, or irritation at site   where injected -pinpoint red spots on the skin -red or dark-brown urine -shortness of breath or breathing problems -stomach or side pain, or pain at the shoulder -swelling -tiredness -trouble passing urine or change in the amount of urine Side  effects that usually do not require medical attention (report to your doctor or health care professional if they continue or are bothersome): -bone pain -muscle pain This list may not describe all possible side effects. Call your doctor for medical advice about side effects. You may report side effects to FDA at 1-800-FDA-1088. Where should I keep my medicine? Keep out of the reach of children. Store pre-filled syringes in a refrigerator between 2 and 8 degrees C (36 and 46 degrees F). Do not freeze. Keep in carton to protect from light. Throw away this medicine if it is left out of the refrigerator for more than 48 hours. Throw away any unused medicine after the expiration date. NOTE: This sheet is a summary. It may not cover all possible information. If you have questions about this medicine, talk to your doctor, pharmacist, or health care provider.  2018 Elsevier/Gold Standard (2016-02-04 12:58:03)  

## 2016-08-02 ENCOUNTER — Telehealth: Payer: Self-pay | Admitting: Hematology

## 2016-08-02 NOTE — Telephone Encounter (Signed)
Added f/u to 6/14 lab - time for lab remains the same. Not able to reach patient/dtr re add on or leave message. Added comment to 6/14 lab asking patient to stay to see Dr. Burr Medico.

## 2016-08-03 NOTE — Progress Notes (Addendum)
[facility]  [phone] [phone]  Clinic Follow Up Note   Patient Care Team: Tawny Asal as PCP - General (Physician Assistant) 08/04/2016   CHIEF COMPLAINTS:  Follow up AML  HISTORY OF PRESENTING ILLNESS:  Morgan Waters 51 y.o. female who presented to the ED on 04/29/16 for fatigue and diffuse intermittent bruising to her bilateral legs and right forearm that started 1 month prior. CBC revealed Hb 7.0, low WBC, RBC, HCT, and elevated RDW. Platelets were low at 54K. Blood smear revealed pancytopenia including neutropenia. 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 I was subsequently contacted and I requested 1 unit of blood, iron work up, and a follow up.  The patient has a history of anemia stretching back to February 2010, the oldest lab results provided. She had an Hb 11.9, Pl 268K, and ANC 12.9 on 04/20/2008. Hb 10.5 on 09/25/2015.  In regards to a family history of anemia ,she reports her daughter had anemia years ago and had to go to [place]. She does not know/remember what it was. Her daughter is a young adult now.  At this time, she reports occasional fatigue. She takes naps when she does have fatigue. She reports occasional night sweats. She had a partial hysterectomy years ago and did not have a menstrual cycle since. Denies fever or pruritus. She reports ecchymoses on her bilateral lower extremities with none on her arms. She denies recent chest pain on exertion, shortness of breath on minimal exertion, pre-syncopal episodes, or palpitations. She had not noticed any recent bleeding such as epistaxis, hematuria or hematochezia. The patient only takes over the counter NSAIDs for muscle pain PRN. She never had a colonoscopy. She had no prior history or diagnosis of cancer. She denies any pica and eats a variety of diet. She never donated blood. She received a blood transfusion on 04/30/16. The patient is not  prescribed any oral iron supplements.   Current Therapy:  The patient [facility] for first consolidation using Hidac (3gm/m2) IV q12h on days 1, 2 and 3 for 6 doses. Plan is for 3 cycles with Hidac if the patient does not proceed with transplant.    INTERVAL HISTORY:  Morgan Waters returns for a follow up. She presents to the clinic with her daughter and 2 grandchildren. Her right eye is irritated and red. She had not been using her eye drops for two days. She will use it when she gets home. She has not had any bleeding or bone pain. She received her last dose 2 weeks ago and will go back in the 2nd week in July for her 2nd dose.  Her first treatment was difficult but she got a good results. She f/u with Dr, Florene Glen.     MEDICAL HISTORY:  Past Medical History:  Diagnosis Date  . Migraine     SURGICAL HISTORY: Past Surgical History:  Procedure Laterality Date  . ABDOMINAL HYSTERECTOMY    . CESAREAN SECTION      SOCIAL HISTORY: Social History   Social History  . Marital status: Divorced    Spouse name: N/A  . Number of children: N/A  . Years of education: N/A   Occupational History  . Not on file.   Social History Main Topics  . Smoking status: Never Smoker  . Smokeless tobacco: Never Used  . Alcohol use Yes     Comment: occasionally  . Drug use: No  . Sexual activity: Not on  file   Other Topics Concern  . Not on file   Social History Narrative  . No narrative on file    FAMILY HISTORY: Family History  Problem Relation Age of Onset  . Stroke Father   . Anemia Daughter     ALLERGIES:  has No Known Allergies.  MEDICATIONS:  Current Outpatient Prescriptions  Medication Sig Dispense Refill  . ibuprofen (ADVIL,MOTRIN) 200 MG tablet Take 200 mg by mouth every 6 (six) hours as needed.    . promethazine (PHENERGAN) 25 MG tablet Take 50 mg by mouth daily.    Marland Kitchen senna-docusate (SENOKOT-S) 8.6-50 MG tablet Take 1 tablet by  mouth as needed.      No current facility-administered medications for this visit.     REVIEW OF SYSTEMS:   Constitutional: Denies fevers, chills or abnormal night sweats Eyes: Denies blurriness of vision, double vision or watery eyes (+) eye redness and irritation Ears, nose, mouth, throat, and face: Denies mucositis or sore throat Respiratory: Denies cough, dyspnea or wheezes Cardiovascular: Denies palpitation, chest discomfort or lower extremity swelling Gastrointestinal:  Denies nausea, heartburn or change in bowel habits Skin: Denies abnormal skin rashes (+) Ecchymoses on the bilateral lower extremities. Lymphatics: Denies new lymphadenopathy or easy bruising Neurological:Denies numbness, tingling or new weaknesses Behavioral/Psych: Mood is stable, no new changes  All other systems were reviewed with the patient and are negative.  PHYSICAL EXAMINATION: ECOG PERFORMANCE STATUS: 3  Vitals:   08/04/16 1304  BP: 111/83  Pulse: 82  Resp: 17  Temp: 98.8 F (37.1 C)   Filed Weights   08/04/16 1304  Weight: 106 lb 8 oz (48.3 kg)    GENERAL:alert, no distress and comfortable, appears to be pale  SKIN: skin color, texture, turgor are normal, no rashes or significant lesions EYES: normal, conjunctiva are pink and non-injected, sclera clear  OROPHARYNX:no exudate, no erythema and lips, buccal mucosa, and tongue normal (+) oral thrush NECK: supple, thyroid normal size, non-tender, without nodularity LYMPH:  no palpable lymphadenopathy in the cervical, axillary or inguinal LUNGS: clear to auscultation and percussion with normal breathing effort HEART: regular rate & rhythm and no murmurs and no lower extremity edema ABDOMEN:abdomen soft, non-tender and normal bowel sounds Musculoskeletal:no cyanosis of digits and no clubbing. (+) Multiple small ecchymoses on her right leg. PSYCH: alert & oriented x 3 with fluent speech NEURO: no focal motor/sensory deficits  LABORATORY DATA:  I  have reviewed the data as listed CBC Latest Ref Rng & Units 08/04/2016 08/01/2016 05/10/2016  WBC 3.9 - 10.3 10e3/uL 0.3(LL) 2.6(L) 2.8(L)  Hemoglobin 11.6 - 15.9 g/dL 9.3(L) 9.2(L) 8.6(L)  Hematocrit 34.8 - 46.6 % 27.0(L) 27.5(L) 24.1(L)  Platelets 145 - 400 10e3/uL 25(L) 89(L) 51(L)    CMP Latest Ref Rng & Units 08/04/2016 08/01/2016 05/10/2016  Glucose 70 - 140 mg/dl 105 83 103(H)  BUN 7.0 - 26.0 mg/dL 14.5 15.0 18  Creatinine 0.6 - 1.1 mg/dL 0.8 0.8 0.96  Sodium 136 - 145 mEq/L 139 138 142  Potassium 3.5 - 5.1 mEq/L 3.8 3.9 3.9  Chloride 101 - 111 mmol/L - - 112(H)  CO2 22 - 29 mEq/L _0 Calcium 8.4 - 10.4 mg/dL 10.1 9.7 8.9  Total Protein 6.4 - 8.3 g/dL 7.8 7.3 -  Total Bilirubin 0.20 - 1.20 mg/dL 2.14(H) 1.44(H) -  Alkaline Phos 40 - 150 U/L 81 69 -  AST 5 - 34 U/L 15 24 -  ALT 0 - 55 U/L 13 20 -  Results for ROLONDA, PONTARELLI (MRN [id number]) as of 05/04/2016 14:12  Ref. Range 04/29/2016 21:55  LDH Latest Ref Range: 98 - 192 U/L 203 (H)  Ferritin Latest Ref Range: 11 - 307 ng/mL 76  Folate Latest Ref Range: >5.9 ng/mL 7.8  Vitamin B12 Latest Ref Range: 180 - 914 pg/mL 340   Absolute reticulocyte count 34.6 Heptoglobin normal  PATHOLOGY Diagnosis 05/10/16 Bone Marrow, Aspirate,Biopsy, and Clot, right iliac - ACUTE MYELOID LEUKEMIA. - SEE COMMENT. PERIPHERAL BLOOD: - PANCYTOPENIA.  Interpretation 05/10/16 Bone Marrow Flow Cytometry - ACUTE MYELOID LEUKEMIA, SEE COMMENT.  PERIPHERAL BLOOD: - PANCYTOPENIA. Diagnosis Note The marrow is mildly hypercellular with increased blasts (25% aspirate, 37% flow cytometry, 30% CD34). There is background dysplasia. Overall, the findings are consistent with acute myeloid leukemia. Dr. Burr Medico was notified on 05/11/2016.  RADIOGRAPHIC STUDIES: I have personally reviewed the radiological images as listed and agreed with the findings in the report. No results found.    ASSESSMENT & PLAN: 51 y.o. African-American female who  presented to the ED for fatigue and upper and lower extremity ecchymoses  1. Acute myeloid leukemia, cytogenetics pending  -I reviewed lab results with the patient that were obtained from workup in the ED on 04/29/16. -Hb 7.0, ANC 0.9, Platlets 54K were below normal. Reticular count is very low, no blasts on the peripheral smear. -She has no history of liver disease, does not drink alcohol heavily, not on any medication - she has had mild anemia since 09/2015,  worsening now, and he developed neutropenia and thrombocytopenia.  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 Bone marrow flow cytometry also revealed acute myeloid leukemia. -I previously discussed the biopsy results with the patient and her family in detail. We discussed the aggressive natural history of acute myeloid leukemia, and urgency of needing treatment as soon as possible. 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 -We previously discussed that that she is likely going to have bone marrow transplant when her leukemia gets into remission after induction chemotherapy. -Labs reviewed and she has 0 ANC and plt 25 today (08/04/16). I discussed with her being safe from infection and going to ED if she gets a fever.  -We will move up her labs to [place].  -Will schedule her for a plt transfusion Monday and a blood transfusion over the next 2 weeks if needed.   2. Pancytopenia -Secondary to recent chemo. -She received a Neulasta earlier this week. -Consider platelet transfusion if less than 20 K, blood transfusion if hemoglobin less than 9.0, with leuko-reduced and irradiated blood products.  3. Financial Issues -The  patient is not working currently and she does not have insurance. -I previously referred her to see our Development worker, community, and will referral to social services to see if she is a candidate for Medicaid.  4. Eye irritation -secondary to chemo therapy -She will continue to take eye drops   5. Oral Thrush -related to chemo therapy and lower immune system  -will order her Nystatin mouth wash today   PLAN -Order Nystatin today for oral thrush  -labs reviewed, will repeat CBC tomorrow if she if she needs plt transfusion (if <20K) -continue lab twice a week for next two weeks before  she returns to [facility] for second cycle chemo  -f/u open   No problem-specific Assessment & Plan notes found for this encounter.    All questions were answered. The patient knows to call the clinic with any problems, questions or concerns.  I spent 20 minutes counseling the patient face to face. The total time spent in the appointment was 25 minutes and more than 50% was on counseling.     Truitt Merle, MD 08/04/2016   This document serves as a record of services personally performed by Truitt Merle, MD. It was created on her behalf by Joslyn Devon, a trained medical scribe. The creation of this record is based on the scribe's personal observations and the provider's statements to them. This document has been checked and approved by the attending provider.

## 2016-08-04 ENCOUNTER — Other Ambulatory Visit (HOSPITAL_BASED_OUTPATIENT_CLINIC_OR_DEPARTMENT_OTHER): Payer: Medicaid Other

## 2016-08-04 ENCOUNTER — Telehealth: Payer: Self-pay | Admitting: Hematology

## 2016-08-04 ENCOUNTER — Ambulatory Visit (HOSPITAL_BASED_OUTPATIENT_CLINIC_OR_DEPARTMENT_OTHER): Payer: Medicaid Other | Admitting: Hematology

## 2016-08-04 VITALS — BP 111/83 | HR 82 | Temp 98.8°F | Resp 17 | Ht 59.0 in | Wt 106.5 lb

## 2016-08-04 DIAGNOSIS — B370 Candidal stomatitis: Secondary | ICD-10-CM | POA: Diagnosis not present

## 2016-08-04 DIAGNOSIS — C9200 Acute myeloblastic leukemia, not having achieved remission: Secondary | ICD-10-CM

## 2016-08-04 DIAGNOSIS — D61810 Antineoplastic chemotherapy induced pancytopenia: Secondary | ICD-10-CM

## 2016-08-04 DIAGNOSIS — H578 Other specified disorders of eye and adnexa: Secondary | ICD-10-CM

## 2016-08-04 DIAGNOSIS — C9201 Acute myeloblastic leukemia, in remission: Secondary | ICD-10-CM

## 2016-08-04 DIAGNOSIS — D61818 Other pancytopenia: Secondary | ICD-10-CM

## 2016-08-04 DIAGNOSIS — C9100 Acute lymphoblastic leukemia not having achieved remission: Secondary | ICD-10-CM | POA: Diagnosis present

## 2016-08-04 LAB — CBC WITH DIFFERENTIAL/PLATELET
BASO%: 2.9 % — AB (ref 0.0–2.0)
Basophils Absolute: 0 10*3/uL (ref 0.0–0.1)
EOS%: 2.9 % (ref 0.0–7.0)
Eosinophils Absolute: 0 10*3/uL (ref 0.0–0.5)
HCT: 27 % — ABNORMAL LOW (ref 34.8–46.6)
HGB: 9.3 g/dL — ABNORMAL LOW (ref 11.6–15.9)
LYMPH%: 88.2 % — AB (ref 14.0–49.7)
MCH: 31.1 pg (ref 25.1–34.0)
MCHC: 34.4 g/dL (ref 31.5–36.0)
MCV: 90.3 fL (ref 79.5–101.0)
MONO#: 0 10*3/uL — ABNORMAL LOW (ref 0.1–0.9)
MONO%: 0 % (ref 0.0–14.0)
NEUT#: 0 10*3/uL — CL (ref 1.5–6.5)
NEUT%: 6 % — ABNORMAL LOW (ref 38.4–76.8)
Platelets: 25 10*3/uL — ABNORMAL LOW (ref 145–400)
RBC: 2.99 10*6/uL — AB (ref 3.70–5.45)
RDW: 14.5 % (ref 11.2–14.5)
WBC: 0.3 10*3/uL — AB (ref 3.9–10.3)
lymph#: 0.3 10*3/uL — ABNORMAL LOW (ref 0.9–3.3)

## 2016-08-04 LAB — COMPREHENSIVE METABOLIC PANEL
ALT: 13 U/L (ref 0–55)
AST: 15 U/L (ref 5–34)
Albumin: 3.9 g/dL (ref 3.5–5.0)
Alkaline Phosphatase: 81 U/L (ref 40–150)
Anion Gap: 12 mEq/L — ABNORMAL HIGH (ref 3–11)
BUN: 14.5 mg/dL (ref 7.0–26.0)
CO2: 23 meq/L (ref 22–29)
Calcium: 10.1 mg/dL (ref 8.4–10.4)
Chloride: 104 mEq/L (ref 98–109)
Creatinine: 0.8 mg/dL (ref 0.6–1.1)
GLUCOSE: 105 mg/dL (ref 70–140)
Potassium: 3.8 mEq/L (ref 3.5–5.1)
SODIUM: 139 meq/L (ref 136–145)
TOTAL PROTEIN: 7.8 g/dL (ref 6.4–8.3)
Total Bilirubin: 2.14 mg/dL — ABNORMAL HIGH (ref 0.20–1.20)

## 2016-08-04 LAB — MAGNESIUM: Magnesium: 1.6 mg/dl (ref 1.5–2.5)

## 2016-08-04 MED ORDER — NYSTATIN 100000 UNIT/ML MT SUSP: 5.0000 mL | Freq: Four times a day (QID) | OROMUCOSAL | 0 refills | Status: AC

## 2016-08-04 NOTE — Telephone Encounter (Signed)
Scheduled appt per 6/14 los - Gave patient AVS and calender.  

## 2016-08-05 ENCOUNTER — Other Ambulatory Visit: Payer: Self-pay | Admitting: *Deleted

## 2016-08-05 ENCOUNTER — Ambulatory Visit: Payer: Medicaid Other

## 2016-08-05 ENCOUNTER — Other Ambulatory Visit (HOSPITAL_BASED_OUTPATIENT_CLINIC_OR_DEPARTMENT_OTHER): Payer: Medicaid Other

## 2016-08-05 ENCOUNTER — Ambulatory Visit (HOSPITAL_COMMUNITY)
Admission: RE | Admit: 2016-08-05 | Discharge: 2016-08-05 | Disposition: A | Discharge status: Home / Self Care | Payer: Medicaid Other | Source: Ambulatory Visit | Attending: Hematology | Admitting: Hematology

## 2016-08-05 DIAGNOSIS — C9201 Acute myeloblastic leukemia, in remission: Secondary | ICD-10-CM

## 2016-08-05 DIAGNOSIS — C9200 Acute myeloblastic leukemia, not having achieved remission: Secondary | ICD-10-CM

## 2016-08-05 LAB — CBC WITH DIFFERENTIAL/PLATELET
HCT: 27.7 % — ABNORMAL LOW (ref 34.8–46.6)
HEMOGLOBIN: 9.4 g/dL — AB (ref 11.6–15.9)
MCH: 30.6 pg (ref 25.1–34.0)
MCHC: 33.9 g/dL (ref 31.5–36.0)
MCV: 90.2 fL (ref 79.5–101.0)
Platelets: 12 10*3/uL — ABNORMAL LOW (ref 145–400)
RBC: 3.07 10*6/uL — AB (ref 3.70–5.45)
RDW: 14.3 % (ref 11.2–14.5)
WBC: 0.4 10*3/uL — AB (ref 3.9–10.3)

## 2016-08-05 LAB — MANUAL DIFFERENTIAL
ALC: 0.4 10*3/uL — AB (ref 0.9–3.3)
ANC (CHCC MAN DIFF): 0 10*3/uL — AB (ref 1.5–6.5)
BAND NEUTROPHILS: 0 % (ref 0–10)
BASOPHIL: 6 % — AB (ref 0–2)
BLASTS: 0 % (ref 0–0)
EOS%: 2 % (ref 0–7)
LYMPH: 92 % — ABNORMAL HIGH (ref 14–49)
MONO: 0 % (ref 0–14)
Metamyelocytes: 0 % (ref 0–0)
Myelocytes: 0 % (ref 0–0)
Other Cell: 0 % (ref 0–0)
PLT EST: DECREASED
PROMYELO: 0 % (ref 0–0)
SEG: 0 % — ABNORMAL LOW (ref 38–77)
Variant Lymph: 0 % (ref 0–0)
nRBC: 0 % (ref 0–0)

## 2016-08-05 LAB — ABO/RH: ABO/RH(D): O POS

## 2016-08-05 MED ORDER — SODIUM CHLORIDE 0.9 % IV SOLN: 250.0000 mL | Freq: Once | INTRAVENOUS | Status: DC

## 2016-08-05 MED ORDER — ACETAMINOPHEN 325 MG PO TABS
ORAL_TABLET | ORAL | Status: AC
  Filled 2016-08-05: qty 2

## 2016-08-05 MED ORDER — SODIUM CHLORIDE 0.9% FLUSH
10.0000 mL | INTRAVENOUS | Status: AC | PRN
  Administered 2016-08-05: 10 mL
  Filled 2016-08-05: qty 10

## 2016-08-05 MED ORDER — HEPARIN SOD (PORK) LOCK FLUSH 100 UNIT/ML IV SOLN
500.0000 [IU] | Freq: Every day | INTRAVENOUS | Status: AC | PRN
Start: 2016-08-05 — End: 2016-08-05
  Administered 2016-08-05: 500 [IU]
  Filled 2016-08-05: qty 5

## 2016-08-05 MED ORDER — ACETAMINOPHEN 325 MG PO TABS
650.0000 mg | ORAL_TABLET | Freq: Once | ORAL | Status: AC
  Administered 2016-08-05: 650 mg via ORAL

## 2016-08-05 NOTE — Progress Notes (Signed)
Pt and VS stable at discharge. 

## 2016-08-05 NOTE — Patient Instructions (Signed)
Platelet Transfusion, Care After °Refer to this sheet in the next few weeks. These instructions provide you with information about caring for yourself after your procedure. Your health care provider may also give you more specific instructions. Your treatment has been planned according to current medical practices, but problems sometimes occur. Call your health care provider if you have any problems or questions after your procedure. °What can I expect after the procedure? °After the procedure, it is common to have: °· Bruising and soreness at the IV site. °· Fever or chills within the first 48 hours of your transfusion. ° °Follow these instructions at home: °· Take medicines only as directed by your health care provider. Ask your health care provider if you can take an over-the-counter pain reliever in case you have a fever or headache a day or two after your transfusion. °· Return to your normal activities as directed by your health care provider. °Contact a health care provider if: °· You have a fever. °· You have a headache. °· You have redness, swelling, or pain at your IV site. °· You have skin itching or a rash. °· You vomit. °· You feel unusually tired or weak. °Get help right away if: °· You have trouble breathing. °· You have a decreased amount of urine or you urinate less often than you normally do. °· Your urine is darker than normal. °· You have pain in your back, abdomen, or chest. °· You have cool, clammy skin. °· You have a rapid heartbeat. °This information is not intended to replace advice given to you by your health care provider. Make sure you discuss any questions you have with your health care provider. °Document Released: 02/28/2014 Document Revised: 07/16/2015 Document Reviewed: 12/18/2013 °Elsevier Interactive Patient Education © 2018 Elsevier Inc. ° °

## 2016-08-08 ENCOUNTER — Other Ambulatory Visit: Payer: Medicaid Other

## 2016-08-08 ENCOUNTER — Inpatient Hospital Stay (HOSPITAL_COMMUNITY): Admission: RE | Admit: 2016-08-08 | Payer: Medicaid Other | Source: Ambulatory Visit

## 2016-08-08 ENCOUNTER — Ambulatory Visit (INDEPENDENT_AMBULATORY_CARE_PROVIDER_SITE_OTHER): Payer: Medicaid Other | Admitting: Physician Assistant

## 2016-08-08 LAB — PREPARE PLATELET PHERESIS: UNIT DIVISION: 0

## 2016-08-08 LAB — BPAM PLATELET PHERESIS
Blood Product Expiration Date: 201806182359
ISSUE DATE / TIME: 201806151604
Unit Type and Rh: 6200

## 2016-08-11 ENCOUNTER — Other Ambulatory Visit: Payer: Medicaid Other

## 2016-08-12 ENCOUNTER — Other Ambulatory Visit: Payer: Medicaid Other

## 2016-08-15 ENCOUNTER — Other Ambulatory Visit: Payer: Self-pay

## 2016-08-15 ENCOUNTER — Other Ambulatory Visit (HOSPITAL_BASED_OUTPATIENT_CLINIC_OR_DEPARTMENT_OTHER): Payer: Medicaid Other

## 2016-08-15 ENCOUNTER — Telehealth: Payer: Self-pay

## 2016-08-15 DIAGNOSIS — C9200 Acute myeloblastic leukemia, not having achieved remission: Secondary | ICD-10-CM

## 2016-08-15 LAB — COMPREHENSIVE METABOLIC PANEL
ALT: 9 U/L (ref 0–55)
AST: 17 U/L (ref 5–34)
Albumin: 3.5 g/dL (ref 3.5–5.0)
Alkaline Phosphatase: 94 U/L (ref 40–150)
Anion Gap: 8 mEq/L (ref 3–11)
BUN: 13.1 mg/dL (ref 7.0–26.0)
CALCIUM: 9.1 mg/dL (ref 8.4–10.4)
CHLORIDE: 111 meq/L — AB (ref 98–109)
CO2: 25 mEq/L (ref 22–29)
Creatinine: 0.8 mg/dL (ref 0.6–1.1)
EGFR: >90 mL/min/{1.73_m2} (ref >90)
Glucose: 88 mg/dl (ref 70–140)
POTASSIUM: 4.1 meq/L (ref 3.5–5.1)
Sodium: 144 mEq/L (ref 136–145)
Total Bilirubin: 0.45 mg/dL (ref 0.20–1.20)
Total Protein: 6.8 g/dL (ref 6.4–8.3)

## 2016-08-15 LAB — CBC WITH DIFFERENTIAL/PLATELET
BASO%: 0.2 % (ref 0.0–2.0)
Basophils Absolute: 0 10*3/uL (ref 0.0–0.1)
EOS%: 0.6 % (ref 0.0–7.0)
Eosinophils Absolute: 0 10*3/uL (ref 0.0–0.5)
HEMATOCRIT: 20.1 % — AB (ref 34.8–46.6)
HGB: 6.6 g/dL — CL (ref 11.6–15.9)
LYMPH%: 18.6 % (ref 14.0–49.7)
MCH: 30.3 pg (ref 25.1–34.0)
MCHC: 32.8 g/dL (ref 31.5–36.0)
MCV: 92.2 fL (ref 79.5–101.0)
MONO#: 0.6 10*3/uL (ref 0.1–0.9)
MONO%: 10.9 % (ref 0.0–14.0)
NEUT#: 3.6 10*3/uL (ref 1.5–6.5)
NEUT%: 69.7 % (ref 38.4–76.8)
Platelets: 193 10*3/uL (ref 145–400)
RBC: 2.18 10*6/uL — ABNORMAL LOW (ref 3.70–5.45)
RDW: 14.2 % (ref 11.2–14.5)
WBC: 5.2 10*3/uL (ref 3.9–10.3)
lymph#: 1 10*3/uL (ref 0.9–3.3)
nRBC: 2 % — ABNORMAL HIGH (ref 0–0)

## 2016-08-15 LAB — MAGNESIUM: MAGNESIUM: 2 mg/dL (ref 1.5–2.5)

## 2016-08-15 NOTE — Telephone Encounter (Signed)
Lab HGB today 6.6. Pt awaiting cycle 2 chemo at [facility]. Called [facility] for transfusion parameters b/c Dr Burr Medico not in office this week.   Called pt and lvm to give [place] an update and to let her know there is an appt at [facility] tomorrow for blood transfusion.   Asked her to call back and confirm

## 2016-08-15 NOTE — Telephone Encounter (Signed)
S/w pt and she said she feels "great". Discussed hgb 6.6 and need for blood no matter how she feels. Gave her directions for [facility]. At 0930. Discussed will be there about 6 hours.

## 2016-08-16 ENCOUNTER — Ambulatory Visit (HOSPITAL_COMMUNITY)
Admission: RE | Admit: 2016-08-16 | Discharge: 2016-08-16 | Disposition: A | Discharge status: Home / Self Care | Payer: Medicaid Other | Source: Ambulatory Visit | Attending: Hematology | Admitting: Hematology

## 2016-08-16 DIAGNOSIS — C9200 Acute myeloblastic leukemia, not having achieved remission: Secondary | ICD-10-CM | POA: Diagnosis not present

## 2016-08-16 LAB — PREPARE RBC (CROSSMATCH)

## 2016-08-16 MED ORDER — SODIUM CHLORIDE 0.9 % IV SOLN
250.0000 mL | Freq: Once | INTRAVENOUS | Status: AC
Start: 2016-08-16 — End: 2016-08-16
  Administered 2016-08-16: 250 mL via INTRAVENOUS

## 2016-08-16 MED ORDER — SODIUM CHLORIDE 0.9% FLUSH
10.0000 mL | INTRAVENOUS | Status: AC | PRN
  Administered 2016-08-16: 10 mL

## 2016-08-16 MED ORDER — HEPARIN SOD (PORK) LOCK FLUSH 100 UNIT/ML IV SOLN
500.0000 [IU] | Freq: Every day | INTRAVENOUS | Status: AC | PRN
  Administered 2016-08-16: 500 [IU]
  Filled 2016-08-16: qty 5

## 2016-08-16 MED ORDER — ACETAMINOPHEN 325 MG PO TABS
650.0000 mg | ORAL_TABLET | Freq: Once | ORAL | Status: AC
Start: 2016-08-16 — End: 2016-08-16
  Administered 2016-08-16: 650 mg via ORAL
  Filled 2016-08-16: qty 2

## 2016-08-16 NOTE — Progress Notes (Signed)
Diagnosis Association: Acute myeloid leukemia not having achieved remission ([place]) (C92.00)  Provider: Dr. Burr Medico  Procedure: Pt was typed and crossmatched. She received 2 units of PRBCs via porta cath.  Pt tolerated procedure well.  Post procedure: Pt alert,oriented and ambulatory at discharge.

## 2016-08-16 NOTE — Discharge Instructions (Signed)

## 2016-08-17 LAB — TYPE AND SCREEN
ABO/RH(D): O POS
Antibody Screen: NEGATIVE
Unit division: 0
Unit division: 0

## 2016-08-17 LAB — BPAM RBC
BLOOD PRODUCT EXPIRATION DATE: 201806302359
Blood Product Expiration Date: 201806302359
ISSUE DATE / TIME: 201806261236
ISSUE DATE / TIME: 201806261236
UNIT TYPE AND RH: 9500
Unit Type and Rh: 9500

## 2016-08-18 ENCOUNTER — Other Ambulatory Visit: Payer: Medicaid Other

## 2016-08-19 ENCOUNTER — Other Ambulatory Visit (HOSPITAL_BASED_OUTPATIENT_CLINIC_OR_DEPARTMENT_OTHER): Payer: Medicaid Other

## 2016-08-19 DIAGNOSIS — C9200 Acute myeloblastic leukemia, not having achieved remission: Secondary | ICD-10-CM

## 2016-08-19 LAB — COMPREHENSIVE METABOLIC PANEL
ALT: 11 U/L (ref 0–55)
AST: 20 U/L (ref 5–34)
Albumin: 3.3 g/dL — ABNORMAL LOW (ref 3.5–5.0)
Alkaline Phosphatase: 87 U/L (ref 40–150)
Anion Gap: 7 mEq/L (ref 3–11)
BILIRUBIN TOTAL: 0.68 mg/dL (ref 0.20–1.20)
BUN: 12.9 mg/dL (ref 7.0–26.0)
CHLORIDE: 112 meq/L — AB (ref 98–109)
CO2: 25 meq/L (ref 22–29)
CREATININE: 0.8 mg/dL (ref 0.6–1.1)
Calcium: 9 mg/dL (ref 8.4–10.4)
EGFR: >90 mL/min/{1.73_m2} (ref >90)
GLUCOSE: 120 mg/dL (ref 70–140)
Potassium: 3.8 mEq/L (ref 3.5–5.1)
SODIUM: 145 meq/L (ref 136–145)
TOTAL PROTEIN: 6.6 g/dL (ref 6.4–8.3)

## 2016-08-19 LAB — CBC WITH DIFFERENTIAL/PLATELET
BASO%: 0.4 % (ref 0.0–2.0)
Basophils Absolute: 0 10*3/uL (ref 0.0–0.1)
EOS%: 0.7 % (ref 0.0–7.0)
Eosinophils Absolute: 0 10*3/uL (ref 0.0–0.5)
HCT: 34 % — ABNORMAL LOW (ref 34.8–46.6)
HGB: 11.3 g/dL — ABNORMAL LOW (ref 11.6–15.9)
LYMPH%: 9.1 % — AB (ref 14.0–49.7)
MCH: 30.6 pg (ref 25.1–34.0)
MCHC: 33.3 g/dL (ref 31.5–36.0)
MCV: 91.9 fL (ref 79.5–101.0)
MONO#: 0.4 10*3/uL (ref 0.1–0.9)
MONO%: 9.1 % (ref 0.0–14.0)
NEUT%: 80.7 % — ABNORMAL HIGH (ref 38.4–76.8)
NEUTROS ABS: 3.4 10*3/uL (ref 1.5–6.5)
Platelets: 222 10*3/uL (ref 145–400)
RBC: 3.71 10*6/uL (ref 3.70–5.45)
RDW: 16.2 % — ABNORMAL HIGH (ref 11.2–14.5)
WBC: 4.2 10*3/uL (ref 3.9–10.3)
lymph#: 0.4 10*3/uL — ABNORMAL LOW (ref 0.9–3.3)

## 2016-08-19 LAB — MAGNESIUM: Magnesium: 1.7 mg/dl (ref 1.5–2.5)

## 2016-08-22 ENCOUNTER — Other Ambulatory Visit (HOSPITAL_BASED_OUTPATIENT_CLINIC_OR_DEPARTMENT_OTHER): Payer: Medicaid Other

## 2016-08-22 DIAGNOSIS — C9200 Acute myeloblastic leukemia, not having achieved remission: Secondary | ICD-10-CM | POA: Diagnosis not present

## 2016-08-22 LAB — CBC WITH DIFFERENTIAL/PLATELET
BASO%: 0.7 % (ref 0.0–2.0)
Basophils Absolute: 0 10*3/uL (ref 0.0–0.1)
EOS ABS: 0 10*3/uL (ref 0.0–0.5)
EOS%: 0.6 % (ref 0.0–7.0)
HCT: 35.2 % (ref 34.8–46.6)
HEMOGLOBIN: 11.7 g/dL (ref 11.6–15.9)
LYMPH#: 0.5 10*3/uL — AB (ref 0.9–3.3)
LYMPH%: 19.6 % (ref 14.0–49.7)
MCH: 30.8 pg (ref 25.1–34.0)
MCHC: 33.3 g/dL (ref 31.5–36.0)
MCV: 92.5 fL (ref 79.5–101.0)
MONO#: 0.5 10*3/uL (ref 0.1–0.9)
MONO%: 19 % — ABNORMAL HIGH (ref 0.0–14.0)
NEUT%: 60.1 % (ref 38.4–76.8)
NEUTROS ABS: 1.7 10*3/uL (ref 1.5–6.5)
PLATELETS: 166 10*3/uL (ref 145–400)
RBC: 3.8 10*6/uL (ref 3.70–5.45)
RDW: 16.6 % — AB (ref 11.2–14.5)
WBC: 2.8 10*3/uL — AB (ref 3.9–10.3)

## 2016-08-22 LAB — COMPREHENSIVE METABOLIC PANEL
ALBUMIN: 3.6 g/dL (ref 3.5–5.0)
ALK PHOS: 86 U/L (ref 40–150)
ALT: 22 U/L (ref 0–55)
ANION GAP: 6 meq/L (ref 3–11)
AST: 31 U/L (ref 5–34)
BILIRUBIN TOTAL: 0.54 mg/dL (ref 0.20–1.20)
BUN: 17.1 mg/dL (ref 7.0–26.0)
CO2: 29 meq/L (ref 22–29)
CREATININE: 0.9 mg/dL (ref 0.6–1.1)
Calcium: 9.4 mg/dL (ref 8.4–10.4)
Chloride: 109 mEq/L (ref 98–109)
GLUCOSE: 83 mg/dL (ref 70–140)
Potassium: 4.7 mEq/L (ref 3.5–5.1)
Sodium: 143 mEq/L (ref 136–145)
TOTAL PROTEIN: 7 g/dL (ref 6.4–8.3)

## 2016-08-22 LAB — MAGNESIUM: Magnesium: 2 mg/dl (ref 1.5–2.5)

## 2016-08-25 ENCOUNTER — Other Ambulatory Visit: Payer: Medicaid Other

## 2016-08-30 ENCOUNTER — Ambulatory Visit: Payer: Medicaid Other

## 2016-08-30 ENCOUNTER — Other Ambulatory Visit: Payer: Self-pay | Admitting: *Deleted

## 2016-08-30 ENCOUNTER — Telehealth: Payer: Self-pay | Admitting: *Deleted

## 2016-08-30 ENCOUNTER — Telehealth: Payer: Self-pay | Admitting: Hematology

## 2016-08-30 DIAGNOSIS — C9200 Acute myeloblastic leukemia, not having achieved remission: Secondary | ICD-10-CM

## 2016-08-30 NOTE — Telephone Encounter (Signed)
Pt is scheduled for labs today as per request protocol from [facility] called requesting appt to be changed to Wed 08/31/16 due to not having a ride today.

## 2016-08-30 NOTE — Telephone Encounter (Signed)
lvm to inform pt of lab only appt today at 3 pm per sch msg

## 2016-08-31 ENCOUNTER — Other Ambulatory Visit (HOSPITAL_BASED_OUTPATIENT_CLINIC_OR_DEPARTMENT_OTHER): Payer: Medicaid Other

## 2016-08-31 DIAGNOSIS — C9200 Acute myeloblastic leukemia, not having achieved remission: Secondary | ICD-10-CM

## 2016-08-31 LAB — CBC WITH DIFFERENTIAL/PLATELET
BASO%: 0.7 % (ref 0.0–2.0)
BASOS ABS: 0 10*3/uL (ref 0.0–0.1)
EOS ABS: 0 10*3/uL (ref 0.0–0.5)
EOS%: 0.7 % (ref 0.0–7.0)
HCT: 36.7 % (ref 34.8–46.6)
HGB: 12 g/dL (ref 11.6–15.9)
LYMPH%: 28.3 % (ref 14.0–49.7)
MCH: 30.4 pg (ref 25.1–34.0)
MCHC: 32.7 g/dL (ref 31.5–36.0)
MCV: 92.9 fL (ref 79.5–101.0)
MONO#: 0.5 10*3/uL (ref 0.1–0.9)
MONO%: 17.1 % — AB (ref 0.0–14.0)
NEUT#: 1.5 10*3/uL (ref 1.5–6.5)
NEUT%: 53.2 % (ref 38.4–76.8)
PLATELETS: 114 10*3/uL — AB (ref 145–400)
RBC: 3.95 10*6/uL (ref 3.70–5.45)
RDW: 15.9 % — ABNORMAL HIGH (ref 11.2–14.5)
WBC: 2.9 10*3/uL — AB (ref 3.9–10.3)
lymph#: 0.8 10*3/uL — ABNORMAL LOW (ref 0.9–3.3)
nRBC: 0 % (ref 0–0)

## 2016-08-31 LAB — COMPREHENSIVE METABOLIC PANEL
ALT: 31 U/L (ref 0–55)
ANION GAP: 9 meq/L (ref 3–11)
AST: 32 U/L (ref 5–34)
Albumin: 3.8 g/dL (ref 3.5–5.0)
Alkaline Phosphatase: 85 U/L (ref 40–150)
BUN: 18 mg/dL (ref 7.0–26.0)
CHLORIDE: 108 meq/L (ref 98–109)
CO2: 25 meq/L (ref 22–29)
Calcium: 9.6 mg/dL (ref 8.4–10.4)
Creatinine: 0.8 mg/dL (ref 0.6–1.1)
Glucose: 94 mg/dl (ref 70–140)
Potassium: 4.2 mEq/L (ref 3.5–5.1)
Sodium: 142 mEq/L (ref 136–145)
Total Bilirubin: 0.61 mg/dL (ref 0.20–1.20)
Total Protein: 7.2 g/dL (ref 6.4–8.3)

## 2016-08-31 LAB — MAGNESIUM: Magnesium: 2 mg/dl (ref 1.5–2.5)

## 2016-09-09 ENCOUNTER — Other Ambulatory Visit (INDEPENDENT_AMBULATORY_CARE_PROVIDER_SITE_OTHER): Payer: Self-pay | Admitting: Physician Assistant

## 2016-09-12 ENCOUNTER — Telehealth: Payer: Self-pay | Admitting: Hematology

## 2016-09-12 ENCOUNTER — Other Ambulatory Visit: Payer: Self-pay | Admitting: *Deleted

## 2016-09-12 DIAGNOSIS — C9200 Acute myeloblastic leukemia, not having achieved remission: Secondary | ICD-10-CM

## 2016-09-12 NOTE — Telephone Encounter (Signed)
sw pt to confirm lab/inj appt7/25 at 215 pm per sch msg

## 2016-09-14 ENCOUNTER — Other Ambulatory Visit (HOSPITAL_BASED_OUTPATIENT_CLINIC_OR_DEPARTMENT_OTHER): Payer: Medicaid Other

## 2016-09-14 ENCOUNTER — Ambulatory Visit (HOSPITAL_BASED_OUTPATIENT_CLINIC_OR_DEPARTMENT_OTHER): Payer: Medicaid Other

## 2016-09-14 VITALS — BP 123/81 | HR 80 | Temp 97.3°F | Resp 18

## 2016-09-14 DIAGNOSIS — Z5189 Encounter for other specified aftercare: Secondary | ICD-10-CM | POA: Diagnosis not present

## 2016-09-14 DIAGNOSIS — C9200 Acute myeloblastic leukemia, not having achieved remission: Secondary | ICD-10-CM | POA: Diagnosis present

## 2016-09-14 LAB — COMPREHENSIVE METABOLIC PANEL
ALT: 33 U/L (ref 0–55)
ANION GAP: 10 meq/L (ref 3–11)
AST: 34 U/L (ref 5–34)
Albumin: 3.6 g/dL (ref 3.5–5.0)
Alkaline Phosphatase: 72 U/L (ref 40–150)
BILIRUBIN TOTAL: 1.05 mg/dL (ref 0.20–1.20)
BUN: 20.5 mg/dL (ref 7.0–26.0)
CHLORIDE: 106 meq/L (ref 98–109)
CO2: 26 meq/L (ref 22–29)
Calcium: 8.9 mg/dL (ref 8.4–10.4)
Creatinine: 0.9 mg/dL (ref 0.6–1.1)
GLUCOSE: 107 mg/dL (ref 70–140)
POTASSIUM: 3.5 meq/L (ref 3.5–5.1)
SODIUM: 141 meq/L (ref 136–145)
Total Protein: 6.6 g/dL (ref 6.4–8.3)

## 2016-09-14 LAB — CBC WITH DIFFERENTIAL/PLATELET
BASO%: 0.7 % (ref 0.0–2.0)
BASOS ABS: 0 10*3/uL (ref 0.0–0.1)
EOS ABS: 0 10*3/uL (ref 0.0–0.5)
EOS%: 0.2 % (ref 0.0–7.0)
HCT: 33.8 % — ABNORMAL LOW (ref 34.8–46.6)
HEMOGLOBIN: 11.2 g/dL — AB (ref 11.6–15.9)
LYMPH%: 8.6 % — AB (ref 14.0–49.7)
MCH: 30.3 pg (ref 25.1–34.0)
MCHC: 33 g/dL (ref 31.5–36.0)
MCV: 91.7 fL (ref 79.5–101.0)
MONO#: 0 10*3/uL — ABNORMAL LOW (ref 0.1–0.9)
MONO%: 0.1 % (ref 0.0–14.0)
NEUT#: 2.8 10*3/uL (ref 1.5–6.5)
NEUT%: 90.4 % — ABNORMAL HIGH (ref 38.4–76.8)
Platelets: 149 10*3/uL (ref 145–400)
RBC: 3.68 10*6/uL — AB (ref 3.70–5.45)
RDW: 16.3 % — AB (ref 11.2–14.5)
WBC: 3.1 10*3/uL — ABNORMAL LOW (ref 3.9–10.3)
lymph#: 0.3 10*3/uL — ABNORMAL LOW (ref 0.9–3.3)

## 2016-09-14 LAB — MAGNESIUM: MAGNESIUM: 2 mg/dL (ref 1.5–2.5)

## 2016-09-14 MED ORDER — PEGFILGRASTIM INJECTION 6 MG/0.6ML ~~LOC~~
6.0000 mg | PREFILLED_SYRINGE | Freq: Once | SUBCUTANEOUS | Status: AC
  Administered 2016-09-14: 6 mg via SUBCUTANEOUS
  Filled 2016-09-14: qty 0.6

## 2016-09-14 NOTE — Patient Instructions (Signed)
Pegfilgrastim injection What is this medicine? PEGFILGRASTIM (PEG fil gra stim) is a long-acting granulocyte colony-stimulating factor that stimulates the growth of neutrophils, a type of white blood cell important in the body's fight against infection. It is used to reduce the incidence of fever and infection in patients with certain types of cancer who are receiving chemotherapy that affects the bone marrow, and to increase survival after being exposed to high doses of radiation. This medicine may be used for other purposes; ask your health care provider or pharmacist if you have questions. COMMON BRAND NAME(S): Neulasta What should I tell my health care provider before I take this medicine? They need to know if you have any of these conditions: -kidney disease -latex allergy -ongoing radiation therapy -sickle cell disease -skin reactions to acrylic adhesives (On-Body Injector only) -an unusual or allergic reaction to pegfilgrastim, filgrastim, other medicines, foods, dyes, or preservatives -pregnant or trying to get pregnant -breast-feeding How should I use this medicine? This medicine is for injection under the skin. If you get this medicine at home, you will be taught how to prepare and give the pre-filled syringe or how to use the On-body Injector. Refer to the patient Instructions for Use for detailed instructions. Use exactly as directed. Tell your healthcare provider immediately if you suspect that the On-body Injector may not have performed as intended or if you suspect the use of the On-body Injector resulted in a missed or partial dose. It is important that you put your used needles and syringes in a special sharps container. Do not put them in a trash can. If you do not have a sharps container, call your pharmacist or healthcare provider to get one. Talk to your pediatrician regarding the use of this medicine in children. While this drug may be prescribed for selected conditions,  precautions do apply. Overdosage: If you think you have taken too much of this medicine contact a poison control center or emergency room at once. NOTE: This medicine is only for you. Do not share this medicine with others. What if I miss a dose? It is important not to miss your dose. Call your doctor or health care professional if you miss your dose. If you miss a dose due to an On-body Injector failure or leakage, a new dose should be administered as soon as possible using a single prefilled syringe for manual use. What may interact with this medicine? Interactions have not been studied. Give your health care provider a list of all the medicines, herbs, non-prescription drugs, or dietary supplements you use. Also tell them if you smoke, drink alcohol, or use illegal drugs. Some items may interact with your medicine. This list may not describe all possible interactions. Give your health care provider a list of all the medicines, herbs, non-prescription drugs, or dietary supplements you use. Also tell them if you smoke, drink alcohol, or use illegal drugs. Some items may interact with your medicine. What should I watch for while using this medicine? You may need blood work done while you are taking this medicine. If you are going to need a MRI, CT scan, or other procedure, tell your doctor that you are using this medicine (On-Body Injector only). What side effects may I notice from receiving this medicine? Side effects that you should report to your doctor or health care professional as soon as possible: -allergic reactions like skin rash, itching or hives, swelling of the face, lips, or tongue -dizziness -fever -pain, redness, or irritation at site   where injected -pinpoint red spots on the skin -red or dark-brown urine -shortness of breath or breathing problems -stomach or side pain, or pain at the shoulder -swelling -tiredness -trouble passing urine or change in the amount of urine Side  effects that usually do not require medical attention (report to your doctor or health care professional if they continue or are bothersome): -bone pain -muscle pain This list may not describe all possible side effects. Call your doctor for medical advice about side effects. You may report side effects to FDA at 1-800-FDA-1088. Where should I keep my medicine? Keep out of the reach of children. Store pre-filled syringes in a refrigerator between 2 and 8 degrees C (36 and 46 degrees F). Do not freeze. Keep in carton to protect from light. Throw away this medicine if it is left out of the refrigerator for more than 48 hours. Throw away any unused medicine after the expiration date. NOTE: This sheet is a summary. It may not cover all possible information. If you have questions about this medicine, talk to your doctor, pharmacist, or health care provider.  2018 Elsevier/Gold Standard (2016-02-04 12:58:03)  

## 2016-09-16 ENCOUNTER — Other Ambulatory Visit (HOSPITAL_BASED_OUTPATIENT_CLINIC_OR_DEPARTMENT_OTHER): Payer: Medicaid Other

## 2016-09-16 DIAGNOSIS — C9200 Acute myeloblastic leukemia, not having achieved remission: Secondary | ICD-10-CM

## 2016-09-16 LAB — COMPREHENSIVE METABOLIC PANEL WITH GFR
ALT: 26 U/L (ref 0–55)
AST: 18 U/L (ref 5–34)
Albumin: 3.8 g/dL (ref 3.5–5.0)
Alkaline Phosphatase: 82 U/L (ref 40–150)
Anion Gap: 9 meq/L (ref 3–11)
BUN: 18.8 mg/dL (ref 7.0–26.0)
CO2: 29 meq/L (ref 22–29)
Calcium: 9.6 mg/dL (ref 8.4–10.4)
Chloride: 101 meq/L (ref 98–109)
Creatinine: 0.9 mg/dL (ref 0.6–1.1)
EGFR: 89 ml/min/1.73 m2 — ABNORMAL LOW
Glucose: 113 mg/dL (ref 70–140)
Potassium: 3.9 meq/L (ref 3.5–5.1)
Sodium: 139 meq/L (ref 136–145)
Total Bilirubin: 1.36 mg/dL — ABNORMAL HIGH (ref 0.20–1.20)
Total Protein: 7.1 g/dL (ref 6.4–8.3)

## 2016-09-16 LAB — CBC WITH DIFFERENTIAL/PLATELET
BASO%: 0.6 % (ref 0.0–2.0)
BASOS ABS: 0 10*3/uL (ref 0.0–0.1)
EOS%: 0.6 % (ref 0.0–7.0)
Eosinophils Absolute: 0 10*3/uL (ref 0.0–0.5)
HCT: 34.8 % (ref 34.8–46.6)
HEMOGLOBIN: 11.7 g/dL (ref 11.6–15.9)
LYMPH%: 15.2 % (ref 14.0–49.7)
MCH: 30.4 pg (ref 25.1–34.0)
MCHC: 33.6 g/dL (ref 31.5–36.0)
MCV: 90.6 fL (ref 79.5–101.0)
MONO#: 0 10*3/uL — ABNORMAL LOW (ref 0.1–0.9)
MONO%: 0.2 % (ref 0.0–14.0)
NEUT#: 1.2 10*3/uL — ABNORMAL LOW (ref 1.5–6.5)
NEUT%: 83.4 % — ABNORMAL HIGH (ref 38.4–76.8)
Platelets: 69 10*3/uL — ABNORMAL LOW (ref 145–400)
RBC: 3.84 10*6/uL (ref 3.70–5.45)
RDW: 16.5 % — AB (ref 11.2–14.5)
WBC: 1.4 10*3/uL — ABNORMAL LOW (ref 3.9–10.3)
lymph#: 0.2 10*3/uL — ABNORMAL LOW (ref 0.9–3.3)

## 2016-09-16 LAB — MAGNESIUM: MAGNESIUM: 2.5 mg/dL (ref 1.5–2.5)

## 2016-09-19 ENCOUNTER — Other Ambulatory Visit: Payer: Self-pay | Admitting: *Deleted

## 2016-09-19 ENCOUNTER — Ambulatory Visit: Payer: Medicaid Other

## 2016-09-19 ENCOUNTER — Other Ambulatory Visit (HOSPITAL_BASED_OUTPATIENT_CLINIC_OR_DEPARTMENT_OTHER): Payer: Medicaid Other

## 2016-09-19 DIAGNOSIS — C9200 Acute myeloblastic leukemia, not having achieved remission: Secondary | ICD-10-CM | POA: Diagnosis present

## 2016-09-19 LAB — COMPREHENSIVE METABOLIC PANEL
ALBUMIN: 4.1 g/dL (ref 3.5–5.0)
ALT: 16 U/L (ref 0–55)
AST: 14 U/L (ref 5–34)
Alkaline Phosphatase: 87 U/L (ref 40–150)
Anion Gap: 8 mEq/L (ref 3–11)
BUN: 23.8 mg/dL (ref 7.0–26.0)
CALCIUM: 9.8 mg/dL (ref 8.4–10.4)
CHLORIDE: 107 meq/L (ref 98–109)
CO2: 28 mEq/L (ref 22–29)
Creatinine: 0.9 mg/dL (ref 0.6–1.1)
EGFR: >90 mL/min/{1.73_m2} (ref >90)
GLUCOSE: 103 mg/dL (ref 70–140)
POTASSIUM: 4.7 meq/L (ref 3.5–5.1)
Sodium: 143 mEq/L (ref 136–145)
TOTAL PROTEIN: 7.8 g/dL (ref 6.4–8.3)
Total Bilirubin: 0.93 mg/dL (ref 0.20–1.20)

## 2016-09-19 LAB — CBC WITH DIFFERENTIAL/PLATELET
BASO%: 0 % (ref 0.0–2.0)
Basophils Absolute: 0 10*3/uL (ref 0.0–0.1)
EOS%: 4.1 % (ref 0.0–7.0)
Eosinophils Absolute: 0 10*3/uL (ref 0.0–0.5)
HEMATOCRIT: 34 % — AB (ref 34.8–46.6)
HEMOGLOBIN: 11.3 g/dL — AB (ref 11.6–15.9)
LYMPH#: 0.3 10*3/uL — AB (ref 0.9–3.3)
LYMPH%: 92.3 % — ABNORMAL HIGH (ref 14.0–49.7)
MCH: 30.1 pg (ref 25.1–34.0)
MCHC: 33.1 g/dL (ref 31.5–36.0)
MCV: 91 fL (ref 79.5–101.0)
MONO#: 0 10*3/uL — AB (ref 0.1–0.9)
MONO%: 1.6 % (ref 0.0–14.0)
NEUT%: 2 % — ABNORMAL LOW (ref 38.4–76.8)
NEUTROS ABS: 0 10*3/uL — AB (ref 1.5–6.5)
PLATELETS: 18 10*3/uL — AB (ref 145–400)
RBC: 3.74 10*6/uL (ref 3.70–5.45)
RDW: 15.8 % — ABNORMAL HIGH (ref 11.2–14.5)
WBC: 0.3 10*3/uL — AB (ref 3.9–10.3)

## 2016-09-19 LAB — MAGNESIUM: Magnesium: 2.2 mg/dl (ref 1.5–2.5)

## 2016-09-20 ENCOUNTER — Telehealth: Payer: Self-pay | Admitting: *Deleted

## 2016-09-20 NOTE — Telephone Encounter (Signed)
Called pt and left message on voice mail reminding pt of appt for platelet transfusion at [facility] on Wed  09/21/16 at 0830 am.

## 2016-09-21 ENCOUNTER — Ambulatory Visit (HOSPITAL_COMMUNITY)
Admission: RE | Admit: 2016-09-21 | Discharge: 2016-09-21 | Disposition: A | Discharge status: Home / Self Care | Payer: Medicaid Other | Source: Ambulatory Visit | Attending: Hematology | Admitting: Hematology

## 2016-09-21 DIAGNOSIS — C9200 Acute myeloblastic leukemia, not having achieved remission: Secondary | ICD-10-CM

## 2016-09-21 MED ORDER — SODIUM CHLORIDE 0.9 % IV SOLN
250.0000 mL | Freq: Once | INTRAVENOUS | Status: AC
  Administered 2016-09-21: 250 mL via INTRAVENOUS

## 2016-09-21 MED ORDER — SODIUM CHLORIDE 0.9% FLUSH: 10.0000 mL | Freq: Once | INTRAVENOUS | Status: DC

## 2016-09-21 MED ORDER — HEPARIN SOD (PORK) LOCK FLUSH 100 UNIT/ML IV SOLN: 500.0000 [IU] | Freq: Once | INTRAVENOUS | Status: DC

## 2016-09-21 MED ORDER — ACETAMINOPHEN 325 MG PO TABS
650.0000 mg | ORAL_TABLET | Freq: Once | ORAL | Status: AC
  Administered 2016-09-21: 650 mg via ORAL
  Filled 2016-09-21: qty 2

## 2016-09-21 NOTE — Discharge Instructions (Signed)
Platelet Transfusion, Care After °Refer to this sheet in the next few weeks. These instructions provide you with information about caring for yourself after your procedure. Your health care provider may also give you more specific instructions. Your treatment has been planned according to current medical practices, but problems sometimes occur. Call your health care provider if you have any problems or questions after your procedure. °What can I expect after the procedure? °After the procedure, it is common to have: °· Bruising and soreness at the IV site. °· Fever or chills within the first 48 hours of your transfusion. ° °Follow these instructions at home: °· Take medicines only as directed by your health care provider. Ask your health care provider if you can take an over-the-counter pain reliever in case you have a fever or headache a day or two after your transfusion. °· Return to your normal activities as directed by your health care provider. °Contact a health care provider if: °· You have a fever. °· You have a headache. °· You have redness, swelling, or pain at your IV site. °· You have skin itching or a rash. °· You vomit. °· You feel unusually tired or weak. °Get help right away if: °· You have trouble breathing. °· You have a decreased amount of urine or you urinate less often than you normally do. °· Your urine is darker than normal. °· You have pain in your back, abdomen, or chest. °· You have cool, clammy skin. °· You have a rapid heartbeat. °This information is not intended to replace advice given to you by your health care provider. Make sure you discuss any questions you have with your health care provider. °Document Released: 02/28/2014 Document Revised: 07/16/2015 Document Reviewed: 12/18/2013 °Elsevier Interactive Patient Education © 2018 Elsevier Inc. ° °

## 2016-09-21 NOTE — Procedures (Signed)
[facility]  Procedure Note  Morgan Waters HYI:[id number] DOB: [birth year] DOA: 09/21/2016   PCP: Dr. Burr Medico  Associated Diagnosis: Acute Myeloid Leukemia  Procedure Note: patient refused to have port accessed.  IV started, I unit of platelets infused per order.  IV discontinued   Condition During Procedure:  Patient stable   Condition at Discharge:  Patient stable and ambulatory at discharge.   Roberto Scales, RN  [facility]

## 2016-09-22 ENCOUNTER — Other Ambulatory Visit: Payer: Self-pay | Admitting: *Deleted

## 2016-09-22 ENCOUNTER — Other Ambulatory Visit (HOSPITAL_BASED_OUTPATIENT_CLINIC_OR_DEPARTMENT_OTHER): Payer: Medicaid Other

## 2016-09-22 ENCOUNTER — Telehealth: Payer: Self-pay | Admitting: *Deleted

## 2016-09-22 DIAGNOSIS — C9200 Acute myeloblastic leukemia, not having achieved remission: Secondary | ICD-10-CM

## 2016-09-22 LAB — COMPREHENSIVE METABOLIC PANEL
ALBUMIN: 3.8 g/dL (ref 3.5–5.0)
ALK PHOS: 92 U/L (ref 40–150)
ALT: 14 U/L (ref 0–55)
ANION GAP: 9 meq/L (ref 3–11)
AST: 14 U/L (ref 5–34)
BUN: 14.2 mg/dL (ref 7.0–26.0)
CALCIUM: 10.1 mg/dL (ref 8.4–10.4)
CO2: 29 mEq/L (ref 22–29)
Chloride: 103 mEq/L (ref 98–109)
Creatinine: 0.9 mg/dL (ref 0.6–1.1)
Glucose: 130 mg/dl (ref 70–140)
POTASSIUM: 4.5 meq/L (ref 3.5–5.1)
Sodium: 141 mEq/L (ref 136–145)
Total Bilirubin: 0.73 mg/dL (ref 0.20–1.20)
Total Protein: 7.5 g/dL (ref 6.4–8.3)

## 2016-09-22 LAB — PREPARE PLATELET PHERESIS: UNIT DIVISION: 0

## 2016-09-22 LAB — CBC WITH DIFFERENTIAL/PLATELET
BASO%: 0.5 % (ref 0.0–2.0)
BASOS ABS: 0 10*3/uL (ref 0.0–0.1)
EOS%: 1.4 % (ref 0.0–7.0)
Eosinophils Absolute: 0.1 10*3/uL (ref 0.0–0.5)
HEMATOCRIT: 28.9 % — AB (ref 34.8–46.6)
HEMOGLOBIN: 9.9 g/dL — AB (ref 11.6–15.9)
LYMPH#: 0.9 10*3/uL (ref 0.9–3.3)
LYMPH%: 21.5 % (ref 14.0–49.7)
MCH: 29.9 pg (ref 25.1–34.0)
MCHC: 34.3 g/dL (ref 31.5–36.0)
MCV: 87.3 fL (ref 79.5–101.0)
MONO#: 1.7 10*3/uL — AB (ref 0.1–0.9)
MONO%: 38.1 % — ABNORMAL HIGH (ref 0.0–14.0)
NEUT#: 1.7 10*3/uL (ref 1.5–6.5)
NEUT%: 38.5 % (ref 38.4–76.8)
Platelets: 8 10*3/uL — CL (ref 145–400)
RBC: 3.31 10*6/uL — ABNORMAL LOW (ref 3.70–5.45)
RDW: 14 % (ref 11.2–14.5)
WBC: 4.4 10*3/uL (ref 3.9–10.3)
nRBC: 0 % (ref 0–0)

## 2016-09-22 LAB — BPAM PLATELET PHERESIS
BLOOD PRODUCT EXPIRATION DATE: 201808032359
ISSUE DATE / TIME: 201808010906
Unit Type and Rh: 5100

## 2016-09-22 LAB — MAGNESIUM: Magnesium: 2.1 mg/dl (ref 1.5–2.5)

## 2016-09-22 NOTE — Telephone Encounter (Signed)
Platelet count  8 today.   Called pt and left message on voice mail re:  Pt is scheduled for platelet transfusion at [facility] at  0900 am on Friday  09/23/16.

## 2016-09-22 NOTE — Telephone Encounter (Signed)
I will schedule her plt transfusion for 8/6, thanks.   Truitt Merle MD

## 2016-09-23 ENCOUNTER — Telehealth: Payer: Self-pay

## 2016-09-23 ENCOUNTER — Ambulatory Visit (HOSPITAL_COMMUNITY)
Admission: RE | Admit: 2016-09-23 | Discharge: 2016-09-23 | Disposition: A | Discharge status: Home / Self Care | Payer: Medicaid Other | Source: Ambulatory Visit | Attending: Hematology | Admitting: Hematology

## 2016-09-23 DIAGNOSIS — C9200 Acute myeloblastic leukemia, not having achieved remission: Secondary | ICD-10-CM | POA: Diagnosis not present

## 2016-09-23 MED ORDER — DIPHENHYDRAMINE HCL 25 MG PO CAPS
25.0000 mg | ORAL_CAPSULE | Freq: Once | ORAL | Status: AC
  Administered 2016-09-23: 25 mg via ORAL
  Filled 2016-09-23: qty 1

## 2016-09-23 MED ORDER — SODIUM CHLORIDE 0.9 % IV SOLN: Freq: Once | INTRAVENOUS | Status: DC

## 2016-09-23 MED ORDER — HEPARIN SOD (PORK) LOCK FLUSH 100 UNIT/ML IV SOLN
500.0000 [IU] | Freq: Once | INTRAVENOUS | Status: AC
  Administered 2016-09-23: 500 [IU]
  Filled 2016-09-23: qty 5

## 2016-09-23 MED ORDER — ACETAMINOPHEN 325 MG PO TABS
650.0000 mg | ORAL_TABLET | Freq: Once | ORAL | Status: AC
  Administered 2016-09-23: 650 mg via ORAL
  Filled 2016-09-23: qty 2

## 2016-09-23 MED ORDER — SODIUM CHLORIDE 0.9% FLUSH
10.0000 mL | Freq: Once | INTRAVENOUS | Status: AC
  Administered 2016-09-23: 10 mL

## 2016-09-23 MED ORDER — SODIUM CHLORIDE 0.9 % IV SOLN
250.0000 mL | Freq: Once | INTRAVENOUS | Status: AC
  Administered 2016-09-23: 250 mL via INTRAVENOUS

## 2016-09-23 NOTE — Progress Notes (Signed)
Pt received 1 unit platelets per order; port accessed and deaccessed with no complications noted; no complications noted  Associated Diagnosis: Acute Myeloid Leukemia  Ordering Provider: Dr. Burr Medico  Pt accompanied by family upon discharge

## 2016-09-23 NOTE — Telephone Encounter (Signed)
Call patient with updated [place] time on 8/6, addedd infision time for 2:00

## 2016-09-23 NOTE — Discharge Instructions (Signed)
Pt received I pack of platelets today.Platelet Transfusion, Care After Refer to this sheet in the next few weeks. These instructions provide you with information about caring for yourself after your procedure. Your health care provider may also give you more specific instructions. Your treatment has been planned according to current medical practices, but problems sometimes occur. Call your health care provider if you have any problems or questions after your procedure. What can I expect after the procedure? After the procedure, it is common to have:  Bruising and soreness at the IV site.  Fever or chills within the first 48 hours of your transfusion.  Follow these instructions at home:  Take medicines only as directed by your health care provider. Ask your health care provider if you can take an over-the-counter pain reliever in case you have a fever or headache a day or two after your transfusion.  Return to your normal activities as directed by your health care provider. Contact a health care provider if:  You have a fever.  You have a headache.  You have redness, swelling, or pain at your IV site.  You have skin itching or a rash.  You vomit.  You feel unusually tired or weak. Get help right away if:  You have trouble breathing.  You have a decreased amount of urine or you urinate less often than you normally do.  Your urine is darker than normal.  You have pain in your back, abdomen, or chest.  You have cool, clammy skin.  You have a rapid heartbeat. This information is not intended to replace advice given to you by your health care provider. Make sure you discuss any questions you have with your health care provider. Document Released: 02/28/2014 Document Revised: 07/16/2015 Document Reviewed: 12/18/2013 Elsevier Interactive Patient Education  2018 [employer]. Platelet Transfusion A platelet transfusion is a procedure in which you receive donated platelets through  an IV tube. Platelets are tiny pieces of blood cells. When a blood vessel is damaged, platelets collect in the damaged area to help form a blood clot. This begins the healing process. If your platelet count gets too low, your blood may have trouble clotting. You may need a platelet transfusion if you have a condition that causes a low number of platelets (thrombocytopenia). A platelet transfusion may be used to stop or prevent bleeding. Tell a health care provider about:  Any allergies you have.  All medicines you are taking, including vitamins, herbs, eye drops, creams, and over-the-counter medicines.  Any problems you or family members have had with anesthetic medicines.  Any blood disorders you have.  Any surgeries you have had.  Any medical conditions you have.  Any reactions you have had during a previous transfusion. What are the risks? Generally, this is a safe procedure. However, problems may occur, including:  Fever with or without chills. The fever usually occurs within the first 4 hours of the transfusion and returns to normal within 48 hours.  Allergic reaction. The reaction is most commonly caused by antibodies your body creates against substances in the transfusion. Signs of an allergic reaction may include itching, hives, difficulty breathing, shock, or low blood pressure.  Sudden (acute) or delayed hemolytic reaction. This rare reaction can occur during the transfusion and up to 28 days after the transfusion. The reaction usually occurs when your bodys defense system (immune system) attacks the new platelets. Signs of a hemolytic reaction may include fever, headache, difficulty breathing, low blood pressure, a rapid heartbeat,  or pain in your back, abdomen, chest, or IV site.  Transfusion-related acute lung injury (TRALI). TRALI can occur within hours of a transfusion, or several days later. This is a rare reaction that causes lung damage. The cause is not  known.  Infection. Signs of this rare complication may include fever, chills, vomiting, a rapid heartbeat, or low blood pressure.  What happens before the procedure?  You may have a blood test to determine your blood type. This is necessary to find out what kind ofplatelets best matches your platelets.  If you have had an allergic reaction to a transfusion in the past, you may be given medicine to help prevent a reaction. Take this medicine only as directed by your health care provider.  Your temperature, blood pressure, and pulse will be monitored before the transfusion. What happens during the procedure?  An IV will be started in your hand or arm.  The transfusion will be attached to your IV tubing. The bag of donated platelets will be attached to your IV tube andgiven into your vein.  Your temperature, blood pressure, and pulse will be monitored regularly during the transfusion. This monitoring is done to help detect early signs of a transfusion reaction.  If you have any signs or symptoms of a reaction, your transfusion will be stopped and you may be given medicine.  When your transfusion is complete, your IV will be removed.  Pressure may be applied to the IV site for a few minutes.  A bandage (dressing) will be applied. The procedure may vary among health care providers and hospitals. What happens after the procedure?  Your blood pressure, temperature, and pulse will be monitored regularly. This information is not intended to replace advice given to you by your health care provider. Make sure you discuss any questions you have with your health care provider. Document Released: 12/05/2006 Document Revised: 07/16/2015 Document Reviewed: 12/18/2013 Elsevier Interactive Patient Education  2018 [employer].

## 2016-09-24 LAB — PREPARE PLATELET PHERESIS: UNIT DIVISION: 0

## 2016-09-24 LAB — BPAM PLATELET PHERESIS
BLOOD PRODUCT EXPIRATION DATE: 201808052359
ISSUE DATE / TIME: 201808030939
Unit Type and Rh: 5100

## 2016-09-26 ENCOUNTER — Other Ambulatory Visit: Payer: Self-pay | Admitting: Hematology

## 2016-09-26 ENCOUNTER — Ambulatory Visit: Payer: Medicaid Other

## 2016-09-26 ENCOUNTER — Other Ambulatory Visit (HOSPITAL_BASED_OUTPATIENT_CLINIC_OR_DEPARTMENT_OTHER): Payer: Medicaid Other

## 2016-09-26 DIAGNOSIS — C9200 Acute myeloblastic leukemia, not having achieved remission: Secondary | ICD-10-CM | POA: Diagnosis not present

## 2016-09-26 LAB — CBC WITH DIFFERENTIAL/PLATELET
BASO%: 0 % (ref 0.0–2.0)
BASOS ABS: 0 10*3/uL (ref 0.0–0.1)
EOS ABS: 0.1 10*3/uL (ref 0.0–0.5)
EOS%: 0.7 % (ref 0.0–7.0)
HCT: 25.7 % — ABNORMAL LOW (ref 34.8–46.6)
HGB: 8.6 g/dL — ABNORMAL LOW (ref 11.6–15.9)
LYMPH%: 20.5 % (ref 14.0–49.7)
MCH: 29.5 pg (ref 25.1–34.0)
MCHC: 33.5 g/dL (ref 31.5–36.0)
MCV: 88 fL (ref 79.5–101.0)
MONO#: 1.1 10*3/uL — AB (ref 0.1–0.9)
MONO%: 14.5 % — ABNORMAL HIGH (ref 0.0–14.0)
NEUT%: 64.3 % (ref 38.4–76.8)
NEUTROS ABS: 4.9 10*3/uL (ref 1.5–6.5)
PLATELETS: 58 10*3/uL — AB (ref 145–400)
RBC: 2.92 10*6/uL — ABNORMAL LOW (ref 3.70–5.45)
RDW: 14.4 % (ref 11.2–14.5)
WBC: 7.6 10*3/uL (ref 3.9–10.3)
lymph#: 1.6 10*3/uL (ref 0.9–3.3)
nRBC: 0 % (ref 0–0)

## 2016-09-26 LAB — COMPREHENSIVE METABOLIC PANEL
ALT: 11 U/L (ref 0–55)
ANION GAP: 7 meq/L (ref 3–11)
AST: 15 U/L (ref 5–34)
Albumin: 3.7 g/dL (ref 3.5–5.0)
Alkaline Phosphatase: 107 U/L (ref 40–150)
BILIRUBIN TOTAL: 0.34 mg/dL (ref 0.20–1.20)
BUN: 17.5 mg/dL (ref 7.0–26.0)
CHLORIDE: 109 meq/L (ref 98–109)
CO2: 27 mEq/L (ref 22–29)
CREATININE: 0.8 mg/dL (ref 0.6–1.1)
Calcium: 9.5 mg/dL (ref 8.4–10.4)
Glucose: 86 mg/dl (ref 70–140)
Potassium: 4.2 mEq/L (ref 3.5–5.1)
Sodium: 143 mEq/L (ref 136–145)
Total Protein: 7 g/dL (ref 6.4–8.3)

## 2016-09-26 LAB — MAGNESIUM: MAGNESIUM: 1.8 mg/dL (ref 1.5–2.5)

## 2016-09-26 NOTE — Progress Notes (Signed)
Patient will not receive Platelets today, but will be scheduled to receive 2 units PRBCs in a few days, per Dr. Burr Medico.  Patient discharged to scheduling. Patient verbalized understanding.

## 2016-09-27 ENCOUNTER — Other Ambulatory Visit: Payer: Self-pay | Admitting: Hematology

## 2016-09-27 ENCOUNTER — Telehealth: Payer: Self-pay | Admitting: *Deleted

## 2016-09-27 DIAGNOSIS — C9200 Acute myeloblastic leukemia, not having achieved remission: Secondary | ICD-10-CM

## 2016-09-27 MED ORDER — HEPARIN SOD (PORK) LOCK FLUSH 100 UNIT/ML IV SOLN
500.0000 [IU] | Freq: Every day | INTRAVENOUS | Status: AC | PRN
  Filled 2016-09-27: qty 5

## 2016-09-27 MED ORDER — DIPHENHYDRAMINE HCL 25 MG PO CAPS: 25.0000 mg | ORAL_CAPSULE | Freq: Once | ORAL | Status: AC

## 2016-09-27 MED ORDER — SODIUM CHLORIDE 0.9% FLUSH
10.0000 mL | INTRAVENOUS | Status: AC | PRN
  Filled 2016-09-27: qty 10

## 2016-09-27 MED ORDER — SODIUM CHLORIDE 0.9% FLUSH
3.0000 mL | INTRAVENOUS | Status: AC | PRN
  Filled 2016-09-27: qty 10

## 2016-09-27 MED ORDER — ACETAMINOPHEN 325 MG PO TABS: 650.0000 mg | ORAL_TABLET | Freq: Once | ORAL | Status: AC

## 2016-09-27 MED ORDER — HEPARIN SOD (PORK) LOCK FLUSH 100 UNIT/ML IV SOLN
250.0000 [IU] | INTRAVENOUS | Status: AC | PRN
  Filled 2016-09-27: qty 5

## 2016-09-27 MED ORDER — SODIUM CHLORIDE 0.9 % IV SOLN: 250.0000 mL | Freq: Once | INTRAVENOUS | Status: AC

## 2016-09-27 NOTE — Telephone Encounter (Signed)
"  Morgan Waters with [facility] [phone]).  Where is this patient?  Have a tube for type and crossmatch, not sure why."   Yesterday's platelet transfusion not needed, pltc = 58.  Provider ordered blood transfusion later this week.  EPIC reads 1:30 pm 09-29-2016 lab.  09-30-2016 transfusion at 10:45 am.  "Today is too early for TXM.  Results will expire.  Patient needs cbc, type and crossmatch on 09-29-2016 for transfusion on 09-30-2016."   Routed to collaborative, provider for review, orders and further communication with lab.

## 2016-09-27 NOTE — Telephone Encounter (Signed)
Please do type and cross on 8/9, before blood transfusion 8/10. Orders are in, OK to cancel the type and cross from today. Thanks   Truitt Merle MD

## 2016-09-29 ENCOUNTER — Other Ambulatory Visit: Payer: Self-pay | Admitting: *Deleted

## 2016-09-29 ENCOUNTER — Other Ambulatory Visit (HOSPITAL_BASED_OUTPATIENT_CLINIC_OR_DEPARTMENT_OTHER): Payer: Medicaid Other

## 2016-09-29 DIAGNOSIS — C9200 Acute myeloblastic leukemia, not having achieved remission: Secondary | ICD-10-CM | POA: Diagnosis not present

## 2016-09-29 LAB — CBC WITH DIFFERENTIAL/PLATELET
BASO%: 0.1 % (ref 0.0–2.0)
BASOS ABS: 0 10*3/uL (ref 0.0–0.1)
EOS ABS: 0.1 10*3/uL (ref 0.0–0.5)
EOS%: 0.6 % (ref 0.0–7.0)
HCT: 25.6 % — ABNORMAL LOW (ref 34.8–46.6)
HGB: 8.7 g/dL — ABNORMAL LOW (ref 11.6–15.9)
LYMPH%: 15.1 % (ref 14.0–49.7)
MCH: 29.7 pg (ref 25.1–34.0)
MCHC: 34 g/dL (ref 31.5–36.0)
MCV: 87.4 fL (ref 79.5–101.0)
MONO#: 0.5 10*3/uL (ref 0.1–0.9)
MONO%: 6.6 % (ref 0.0–14.0)
NEUT#: 6.1 10*3/uL (ref 1.5–6.5)
NEUT%: 77.6 % — AB (ref 38.4–76.8)
PLATELETS: 236 10*3/uL (ref 145–400)
RBC: 2.93 10*6/uL — AB (ref 3.70–5.45)
RDW: 14.1 % (ref 11.2–14.5)
WBC: 7.9 10*3/uL (ref 3.9–10.3)
lymph#: 1.2 10*3/uL (ref 0.9–3.3)

## 2016-09-29 LAB — PREPARE RBC (CROSSMATCH)

## 2016-09-30 ENCOUNTER — Ambulatory Visit (HOSPITAL_BASED_OUTPATIENT_CLINIC_OR_DEPARTMENT_OTHER): Payer: Medicaid Other

## 2016-09-30 DIAGNOSIS — C9200 Acute myeloblastic leukemia, not having achieved remission: Secondary | ICD-10-CM

## 2016-09-30 MED ORDER — DIPHENHYDRAMINE HCL 25 MG PO CAPS
ORAL_CAPSULE | ORAL | Status: AC
  Filled 2016-09-30: qty 1

## 2016-09-30 MED ORDER — ACETAMINOPHEN 325 MG PO TABS
650.0000 mg | ORAL_TABLET | Freq: Once | ORAL | Status: AC
  Administered 2016-09-30: 650 mg via ORAL

## 2016-09-30 MED ORDER — DIPHENHYDRAMINE HCL 25 MG PO CAPS
25.0000 mg | ORAL_CAPSULE | Freq: Once | ORAL | Status: AC
  Administered 2016-09-30: 25 mg via ORAL

## 2016-09-30 MED ORDER — SODIUM CHLORIDE 0.9 % IV SOLN
250.0000 mL | Freq: Once | INTRAVENOUS | Status: AC
  Administered 2016-09-30: 250 mL via INTRAVENOUS

## 2016-09-30 MED ORDER — SODIUM CHLORIDE 0.9% FLUSH
10.0000 mL | Freq: Once | INTRAVENOUS | Status: AC
  Administered 2016-09-30: 10 mL
  Filled 2016-09-30: qty 10

## 2016-09-30 MED ORDER — ACETAMINOPHEN 325 MG PO TABS
ORAL_TABLET | ORAL | Status: AC
  Filled 2016-09-30: qty 2

## 2016-09-30 MED ORDER — HEPARIN SOD (PORK) LOCK FLUSH 100 UNIT/ML IV SOLN
500.0000 [IU] | Freq: Once | INTRAVENOUS | Status: AC
  Administered 2016-09-30: 500 [IU]
  Filled 2016-09-30: qty 5

## 2016-09-30 NOTE — Patient Instructions (Signed)

## 2016-10-01 LAB — BPAM RBC
BLOOD PRODUCT EXPIRATION DATE: 201809052359
BLOOD PRODUCT EXPIRATION DATE: 201809052359
ISSUE DATE / TIME: 201808101213
ISSUE DATE / TIME: 201808101227
UNIT TYPE AND RH: 5100
Unit Type and Rh: 5100

## 2016-10-01 LAB — TYPE AND SCREEN
ABO/RH(D): O POS
ANTIBODY SCREEN: NEGATIVE
Unit division: 0
Unit division: 0

## 2016-10-03 ENCOUNTER — Other Ambulatory Visit (HOSPITAL_BASED_OUTPATIENT_CLINIC_OR_DEPARTMENT_OTHER): Payer: Medicaid Other

## 2016-10-03 DIAGNOSIS — C9200 Acute myeloblastic leukemia, not having achieved remission: Secondary | ICD-10-CM

## 2016-10-03 LAB — CBC WITH DIFFERENTIAL/PLATELET
BASO%: 0.2 % (ref 0.0–2.0)
BASOS ABS: 0 10*3/uL (ref 0.0–0.1)
EOS%: 0.3 % (ref 0.0–7.0)
Eosinophils Absolute: 0 10*3/uL (ref 0.0–0.5)
HEMATOCRIT: 37.3 % (ref 34.8–46.6)
HGB: 12.7 g/dL (ref 11.6–15.9)
LYMPH#: 0.9 10*3/uL (ref 0.9–3.3)
LYMPH%: 14.7 % (ref 14.0–49.7)
MCH: 30.3 pg (ref 25.1–34.0)
MCHC: 34 g/dL (ref 31.5–36.0)
MCV: 89 fL (ref 79.5–101.0)
MONO#: 0.4 10*3/uL (ref 0.1–0.9)
MONO%: 7.3 % (ref 0.0–14.0)
NEUT#: 4.5 10*3/uL (ref 1.5–6.5)
NEUT%: 77.5 % — AB (ref 38.4–76.8)
PLATELETS: 309 10*3/uL (ref 145–400)
RBC: 4.19 10*6/uL (ref 3.70–5.45)
RDW: 15 % — ABNORMAL HIGH (ref 11.2–14.5)
WBC: 5.8 10*3/uL (ref 3.9–10.3)

## 2016-10-03 LAB — COMPREHENSIVE METABOLIC PANEL
ALBUMIN: 3.6 g/dL (ref 3.5–5.0)
ALK PHOS: 101 U/L (ref 40–150)
ALT: 22 U/L (ref 0–55)
AST: 29 U/L (ref 5–34)
Anion Gap: 6 mEq/L (ref 3–11)
BUN: 20.8 mg/dL (ref 7.0–26.0)
CALCIUM: 9.8 mg/dL (ref 8.4–10.4)
CHLORIDE: 109 meq/L (ref 98–109)
CO2: 28 mEq/L (ref 22–29)
Creatinine: 0.9 mg/dL (ref 0.6–1.1)
Glucose: 83 mg/dl (ref 70–140)
Potassium: 4.2 mEq/L (ref 3.5–5.1)
Sodium: 143 mEq/L (ref 136–145)
Total Bilirubin: 0.51 mg/dL (ref 0.20–1.20)
Total Protein: 7.2 g/dL (ref 6.4–8.3)

## 2016-10-03 LAB — MAGNESIUM: Magnesium: 2 mg/dl (ref 1.5–2.5)

## 2016-10-06 ENCOUNTER — Other Ambulatory Visit: Payer: Medicaid Other

## 2016-10-07 ENCOUNTER — Other Ambulatory Visit (HOSPITAL_BASED_OUTPATIENT_CLINIC_OR_DEPARTMENT_OTHER): Payer: Medicaid Other

## 2016-10-07 DIAGNOSIS — C9200 Acute myeloblastic leukemia, not having achieved remission: Secondary | ICD-10-CM

## 2016-10-07 LAB — CBC WITH DIFFERENTIAL/PLATELET
BASO%: 0.8 % (ref 0.0–2.0)
BASOS ABS: 0 10*3/uL (ref 0.0–0.1)
EOS ABS: 0 10*3/uL (ref 0.0–0.5)
EOS%: 0.5 % (ref 0.0–7.0)
HCT: 40.5 % (ref 34.8–46.6)
HGB: 13.5 g/dL (ref 11.6–15.9)
LYMPH#: 0.5 10*3/uL — AB (ref 0.9–3.3)
LYMPH%: 13.7 % — AB (ref 14.0–49.7)
MCH: 30.1 pg (ref 25.1–34.0)
MCHC: 33.4 g/dL (ref 31.5–36.0)
MCV: 90.2 fL (ref 79.5–101.0)
MONO#: 0.9 10*3/uL (ref 0.1–0.9)
MONO%: 22.8 % — AB (ref 0.0–14.0)
NEUT%: 62.2 % (ref 38.4–76.8)
NEUTROS ABS: 2.4 10*3/uL (ref 1.5–6.5)
PLATELETS: 296 10*3/uL (ref 145–400)
RBC: 4.49 10*6/uL (ref 3.70–5.45)
RDW: 15.7 % — ABNORMAL HIGH (ref 11.2–14.5)
WBC: 3.9 10*3/uL (ref 3.9–10.3)

## 2016-10-07 LAB — COMPREHENSIVE METABOLIC PANEL
ALBUMIN: 3.9 g/dL (ref 3.5–5.0)
ALK PHOS: 99 U/L (ref 40–150)
ALT: 25 U/L (ref 0–55)
ANION GAP: 6 meq/L (ref 3–11)
AST: 28 U/L (ref 5–34)
BILIRUBIN TOTAL: 0.58 mg/dL (ref 0.20–1.20)
BUN: 22.1 mg/dL (ref 7.0–26.0)
CO2: 28 meq/L (ref 22–29)
Calcium: 9.7 mg/dL (ref 8.4–10.4)
Chloride: 107 mEq/L (ref 98–109)
Creatinine: 0.8 mg/dL (ref 0.6–1.1)
Glucose: 86 mg/dl (ref 70–140)
POTASSIUM: 4.3 meq/L (ref 3.5–5.1)
Sodium: 142 mEq/L (ref 136–145)
TOTAL PROTEIN: 7.5 g/dL (ref 6.4–8.3)

## 2016-10-07 LAB — MAGNESIUM: Magnesium: 2.1 mg/dl (ref 1.5–2.5)

## 2016-10-10 ENCOUNTER — Other Ambulatory Visit: Payer: Medicaid Other

## 2016-10-10 ENCOUNTER — Other Ambulatory Visit (HOSPITAL_BASED_OUTPATIENT_CLINIC_OR_DEPARTMENT_OTHER): Payer: Medicaid Other

## 2016-10-10 DIAGNOSIS — C9200 Acute myeloblastic leukemia, not having achieved remission: Secondary | ICD-10-CM

## 2016-10-10 LAB — CBC WITH DIFFERENTIAL/PLATELET
BASO%: 0.2 % (ref 0.0–2.0)
BASOS ABS: 0 10*3/uL (ref 0.0–0.1)
EOS ABS: 0 10*3/uL (ref 0.0–0.5)
EOS%: 0.2 % (ref 0.0–7.0)
HEMATOCRIT: 41.9 % (ref 34.8–46.6)
HGB: 14 g/dL (ref 11.6–15.9)
LYMPH#: 0.5 10*3/uL — AB (ref 0.9–3.3)
LYMPH%: 9.6 % — AB (ref 14.0–49.7)
MCH: 30.2 pg (ref 25.1–34.0)
MCHC: 33.4 g/dL (ref 31.5–36.0)
MCV: 90.5 fL (ref 79.5–101.0)
MONO#: 0.4 10*3/uL (ref 0.1–0.9)
MONO%: 8.2 % (ref 0.0–14.0)
NEUT#: 4.3 10*3/uL (ref 1.5–6.5)
NEUT%: 81.8 % — AB (ref 38.4–76.8)
PLATELETS: 205 10*3/uL (ref 145–400)
RBC: 4.63 10*6/uL (ref 3.70–5.45)
RDW: 15.2 % — ABNORMAL HIGH (ref 11.2–14.5)
WBC: 5.2 10*3/uL (ref 3.9–10.3)

## 2016-10-10 LAB — COMPREHENSIVE METABOLIC PANEL
ALT: 26 U/L (ref 0–55)
ANION GAP: 10 meq/L (ref 3–11)
AST: 28 U/L (ref 5–34)
Albumin: 4.2 g/dL (ref 3.5–5.0)
Alkaline Phosphatase: 100 U/L (ref 40–150)
BUN: 14.4 mg/dL (ref 7.0–26.0)
CALCIUM: 10.4 mg/dL (ref 8.4–10.4)
CHLORIDE: 104 meq/L (ref 98–109)
CO2: 27 meq/L (ref 22–29)
CREATININE: 0.8 mg/dL (ref 0.6–1.1)
Glucose: 89 mg/dl (ref 70–140)
POTASSIUM: 4.1 meq/L (ref 3.5–5.1)
Sodium: 141 mEq/L (ref 136–145)
Total Bilirubin: 0.9 mg/dL (ref 0.20–1.20)
Total Protein: 8.2 g/dL (ref 6.4–8.3)

## 2016-10-10 LAB — MAGNESIUM: MAGNESIUM: 2.1 mg/dL (ref 1.5–2.5)

## 2016-10-13 ENCOUNTER — Other Ambulatory Visit: Payer: Medicaid Other

## 2016-10-14 ENCOUNTER — Other Ambulatory Visit (HOSPITAL_BASED_OUTPATIENT_CLINIC_OR_DEPARTMENT_OTHER): Payer: Medicaid Other

## 2016-10-14 ENCOUNTER — Telehealth: Payer: Self-pay | Admitting: *Deleted

## 2016-10-14 DIAGNOSIS — C9200 Acute myeloblastic leukemia, not having achieved remission: Secondary | ICD-10-CM

## 2016-10-14 LAB — CBC WITH DIFFERENTIAL/PLATELET
BASO%: 0.9 % (ref 0.0–2.0)
BASOS ABS: 0 10*3/uL (ref 0.0–0.1)
EOS ABS: 0 10*3/uL (ref 0.0–0.5)
EOS%: 0.4 % (ref 0.0–7.0)
HEMATOCRIT: 40.4 % (ref 34.8–46.6)
HEMOGLOBIN: 13.4 g/dL (ref 11.6–15.9)
LYMPH%: 19.4 % (ref 14.0–49.7)
MCH: 29.7 pg (ref 25.1–34.0)
MCHC: 33 g/dL (ref 31.5–36.0)
MCV: 90 fL (ref 79.5–101.0)
MONO#: 0.9 10*3/uL (ref 0.1–0.9)
MONO%: 19 % — AB (ref 0.0–14.0)
NEUT#: 2.8 10*3/uL (ref 1.5–6.5)
NEUT%: 60.3 % (ref 38.4–76.8)
Platelets: 151 10*3/uL (ref 145–400)
RBC: 4.49 10*6/uL (ref 3.70–5.45)
RDW: 15.8 % — AB (ref 11.2–14.5)
WBC: 4.7 10*3/uL (ref 3.9–10.3)
lymph#: 0.9 10*3/uL (ref 0.9–3.3)

## 2016-10-14 NOTE — Telephone Encounter (Signed)
Message left on VM that labs OK, no need for blood or platelets.

## 2016-10-17 ENCOUNTER — Other Ambulatory Visit: Payer: Medicaid Other

## 2016-10-18 ENCOUNTER — Other Ambulatory Visit (HOSPITAL_BASED_OUTPATIENT_CLINIC_OR_DEPARTMENT_OTHER): Payer: Medicaid Other

## 2016-10-18 DIAGNOSIS — C9200 Acute myeloblastic leukemia, not having achieved remission: Secondary | ICD-10-CM

## 2016-10-18 LAB — CBC WITH DIFFERENTIAL/PLATELET
BASO%: 1 % (ref 0.0–2.0)
BASOS ABS: 0.1 10*3/uL (ref 0.0–0.1)
EOS%: 1.1 % (ref 0.0–7.0)
Eosinophils Absolute: 0.1 10*3/uL (ref 0.0–0.5)
HEMATOCRIT: 39.5 % (ref 34.8–46.6)
HGB: 13.2 g/dL (ref 11.6–15.9)
LYMPH%: 20.9 % (ref 14.0–49.7)
MCH: 30.1 pg (ref 25.1–34.0)
MCHC: 33.4 g/dL (ref 31.5–36.0)
MCV: 90.1 fL (ref 79.5–101.0)
MONO#: 1 10*3/uL — ABNORMAL HIGH (ref 0.1–0.9)
MONO%: 17.3 % — AB (ref 0.0–14.0)
NEUT%: 59.7 % (ref 38.4–76.8)
NEUTROS ABS: 3.4 10*3/uL (ref 1.5–6.5)
PLATELETS: 153 10*3/uL (ref 145–400)
RBC: 4.38 10*6/uL (ref 3.70–5.45)
RDW: 15.9 % — AB (ref 11.2–14.5)
WBC: 5.6 10*3/uL (ref 3.9–10.3)
lymph#: 1.2 10*3/uL (ref 0.9–3.3)

## 2016-10-18 LAB — COMPREHENSIVE METABOLIC PANEL
ALT: 20 U/L (ref 0–55)
AST: 24 U/L (ref 5–34)
Albumin: 3.9 g/dL (ref 3.5–5.0)
Alkaline Phosphatase: 111 U/L (ref 40–150)
Anion Gap: 7 mEq/L (ref 3–11)
BUN: 14.8 mg/dL (ref 7.0–26.0)
CHLORIDE: 105 meq/L (ref 98–109)
CO2: 29 meq/L (ref 22–29)
CREATININE: 0.8 mg/dL (ref 0.6–1.1)
Calcium: 9.7 mg/dL (ref 8.4–10.4)
EGFR: >90 mL/min/{1.73_m2} (ref >90)
Glucose: 61 mg/dl — ABNORMAL LOW (ref 70–140)
Potassium: 4.2 mEq/L (ref 3.5–5.1)
Sodium: 141 mEq/L (ref 136–145)
Total Bilirubin: 0.69 mg/dL (ref 0.20–1.20)
Total Protein: 7.7 g/dL (ref 6.4–8.3)

## 2016-10-18 LAB — MAGNESIUM: Magnesium: 2.1 mg/dl (ref 1.5–2.5)

## 2016-10-20 ENCOUNTER — Other Ambulatory Visit: Payer: Medicaid Other

## 2016-10-20 ENCOUNTER — Other Ambulatory Visit (HOSPITAL_BASED_OUTPATIENT_CLINIC_OR_DEPARTMENT_OTHER): Payer: Medicaid Other

## 2016-10-20 DIAGNOSIS — C9200 Acute myeloblastic leukemia, not having achieved remission: Secondary | ICD-10-CM

## 2016-10-20 LAB — CBC WITH DIFFERENTIAL/PLATELET
BASO%: 0.8 % (ref 0.0–2.0)
BASOS ABS: 0.1 10*3/uL (ref 0.0–0.1)
EOS%: 0.8 % (ref 0.0–7.0)
Eosinophils Absolute: 0.1 10*3/uL (ref 0.0–0.5)
HEMATOCRIT: 36.4 % (ref 34.8–46.6)
HEMOGLOBIN: 12.1 g/dL (ref 11.6–15.9)
LYMPH#: 1.3 10*3/uL (ref 0.9–3.3)
LYMPH%: 20.3 % (ref 14.0–49.7)
MCH: 30 pg (ref 25.1–34.0)
MCHC: 33.2 g/dL (ref 31.5–36.0)
MCV: 90.1 fL (ref 79.5–101.0)
MONO#: 0.8 10*3/uL (ref 0.1–0.9)
MONO%: 12.9 % (ref 0.0–14.0)
NEUT%: 65.2 % (ref 38.4–76.8)
NEUTROS ABS: 4.1 10*3/uL (ref 1.5–6.5)
Platelets: 134 10*3/uL — ABNORMAL LOW (ref 145–400)
RBC: 4.04 10*6/uL (ref 3.70–5.45)
RDW: 15.3 % — AB (ref 11.2–14.5)
WBC: 6.3 10*3/uL (ref 3.9–10.3)

## 2016-10-26 ENCOUNTER — Other Ambulatory Visit (HOSPITAL_BASED_OUTPATIENT_CLINIC_OR_DEPARTMENT_OTHER): Payer: Medicaid Other

## 2016-10-26 DIAGNOSIS — C9200 Acute myeloblastic leukemia, not having achieved remission: Secondary | ICD-10-CM

## 2016-10-26 LAB — CBC WITH DIFFERENTIAL/PLATELET
BASO%: 0.6 % (ref 0.0–2.0)
Basophils Absolute: 0 10*3/uL (ref 0.0–0.1)
EOS%: 1.1 % (ref 0.0–7.0)
Eosinophils Absolute: 0.1 10*3/uL (ref 0.0–0.5)
HEMATOCRIT: 36.5 % (ref 34.8–46.6)
HGB: 12.1 g/dL (ref 11.6–15.9)
LYMPH#: 1.4 10*3/uL (ref 0.9–3.3)
LYMPH%: 20.8 % (ref 14.0–49.7)
MCH: 30 pg (ref 25.1–34.0)
MCHC: 33.2 g/dL (ref 31.5–36.0)
MCV: 90.3 fL (ref 79.5–101.0)
MONO#: 0.8 10*3/uL (ref 0.1–0.9)
MONO%: 11.4 % (ref 0.0–14.0)
NEUT#: 4.4 10*3/uL (ref 1.5–6.5)
NEUT%: 66.1 % (ref 38.4–76.8)
Platelets: 192 10*3/uL (ref 145–400)
RBC: 4.04 10*6/uL (ref 3.70–5.45)
RDW: 15.6 % — ABNORMAL HIGH (ref 11.2–14.5)
WBC: 6.6 10*3/uL (ref 3.9–10.3)

## 2016-10-26 LAB — COMPREHENSIVE METABOLIC PANEL
ALT: 16 U/L (ref 0–55)
ANION GAP: 9 meq/L (ref 3–11)
AST: 26 U/L (ref 5–34)
Albumin: 3.9 g/dL (ref 3.5–5.0)
Alkaline Phosphatase: 96 U/L (ref 40–150)
BILIRUBIN TOTAL: 0.57 mg/dL (ref 0.20–1.20)
BUN: 17.8 mg/dL (ref 7.0–26.0)
CHLORIDE: 109 meq/L (ref 98–109)
CO2: 26 meq/L (ref 22–29)
Calcium: 9.8 mg/dL (ref 8.4–10.4)
Creatinine: 0.9 mg/dL (ref 0.6–1.1)
Glucose: 98 mg/dl (ref 70–140)
Potassium: 4.1 mEq/L (ref 3.5–5.1)
SODIUM: 144 meq/L (ref 136–145)
Total Protein: 7.3 g/dL (ref 6.4–8.3)

## 2016-10-26 LAB — MAGNESIUM: MAGNESIUM: 2.2 mg/dL (ref 1.5–2.5)

## 2016-10-27 ENCOUNTER — Other Ambulatory Visit (HOSPITAL_BASED_OUTPATIENT_CLINIC_OR_DEPARTMENT_OTHER): Payer: Medicaid Other

## 2016-10-27 DIAGNOSIS — C9200 Acute myeloblastic leukemia, not having achieved remission: Secondary | ICD-10-CM

## 2016-10-27 LAB — COMPREHENSIVE METABOLIC PANEL
ALT: 16 U/L (ref 0–55)
AST: 25 U/L (ref 5–34)
Albumin: 4 g/dL (ref 3.5–5.0)
Alkaline Phosphatase: 92 U/L (ref 40–150)
Anion Gap: 10 mEq/L (ref 3–11)
BUN: 17.3 mg/dL (ref 7.0–26.0)
CALCIUM: 9.5 mg/dL (ref 8.4–10.4)
CHLORIDE: 108 meq/L (ref 98–109)
CO2: 25 meq/L (ref 22–29)
CREATININE: 0.9 mg/dL (ref 0.6–1.1)
EGFR: >90 mL/min/{1.73_m2} (ref >90)
GLUCOSE: 83 mg/dL (ref 70–140)
POTASSIUM: 4 meq/L (ref 3.5–5.1)
SODIUM: 143 meq/L (ref 136–145)
Total Bilirubin: 0.63 mg/dL (ref 0.20–1.20)
Total Protein: 7.5 g/dL (ref 6.4–8.3)

## 2016-10-27 LAB — CBC WITH DIFFERENTIAL/PLATELET
BASO%: 0.9 % (ref 0.0–2.0)
BASOS ABS: 0.1 10*3/uL (ref 0.0–0.1)
EOS%: 1.2 % (ref 0.0–7.0)
Eosinophils Absolute: 0.1 10*3/uL (ref 0.0–0.5)
HEMATOCRIT: 36.4 % (ref 34.8–46.6)
HGB: 12.1 g/dL (ref 11.6–15.9)
LYMPH#: 2.1 10*3/uL (ref 0.9–3.3)
LYMPH%: 28.3 % (ref 14.0–49.7)
MCH: 29.7 pg (ref 25.1–34.0)
MCHC: 33.2 g/dL (ref 31.5–36.0)
MCV: 89.4 fL (ref 79.5–101.0)
MONO#: 0.8 10*3/uL (ref 0.1–0.9)
MONO%: 10.3 % (ref 0.0–14.0)
NEUT#: 4.4 10*3/uL (ref 1.5–6.5)
NEUT%: 59.3 % (ref 38.4–76.8)
Platelets: 191 10*3/uL (ref 145–400)
RBC: 4.07 10*6/uL (ref 3.70–5.45)
RDW: 15.6 % — ABNORMAL HIGH (ref 11.2–14.5)
WBC: 7.5 10*3/uL (ref 3.9–10.3)

## 2016-10-27 LAB — MAGNESIUM: MAGNESIUM: 1.9 mg/dL (ref 1.5–2.5)

## 2016-11-04 ENCOUNTER — Other Ambulatory Visit: Payer: Self-pay | Admitting: *Deleted

## 2016-11-04 ENCOUNTER — Telehealth: Payer: Self-pay | Admitting: *Deleted

## 2016-11-04 ENCOUNTER — Other Ambulatory Visit: Payer: Self-pay | Admitting: Hematology

## 2016-11-04 DIAGNOSIS — C9200 Acute myeloblastic leukemia, not having achieved remission: Secondary | ICD-10-CM

## 2016-11-04 NOTE — Telephone Encounter (Signed)
Faxed pt's  appt calendar to Darliss Cheney, RN @ [facility] to give to pt. Fax     [phone].

## 2016-11-04 NOTE — Telephone Encounter (Signed)
Darliss Cheney will fax orders to [place] for Neulasta injections.  [phone]  Pt needs appt set for Wed or Thurs. For injections and 2 wkly labs started.Marland KitchenMarland Kitchen

## 2016-11-07 ENCOUNTER — Telehealth: Payer: Self-pay

## 2016-11-07 ENCOUNTER — Other Ambulatory Visit: Payer: Medicaid Other

## 2016-11-07 NOTE — Telephone Encounter (Signed)
Spoke with patient concerning upcoming appointments for October. Per 9/14 messages los. Patient requested all appointments be at 3:30 pm.

## 2016-11-10 ENCOUNTER — Ambulatory Visit: Payer: Medicaid Other

## 2016-11-10 ENCOUNTER — Other Ambulatory Visit: Payer: Medicaid Other

## 2016-11-14 ENCOUNTER — Other Ambulatory Visit: Payer: Medicaid Other

## 2016-11-16 ENCOUNTER — Ambulatory Visit (HOSPITAL_COMMUNITY)
Admission: RE | Admit: 2016-11-16 | Discharge: 2016-11-16 | Disposition: A | Discharge status: Home / Self Care | Payer: Medicaid Other | Source: Ambulatory Visit | Attending: Hematology | Admitting: Hematology

## 2016-11-16 ENCOUNTER — Telehealth: Payer: Self-pay | Admitting: *Deleted

## 2016-11-16 ENCOUNTER — Other Ambulatory Visit: Payer: Self-pay | Admitting: *Deleted

## 2016-11-16 ENCOUNTER — Other Ambulatory Visit (HOSPITAL_BASED_OUTPATIENT_CLINIC_OR_DEPARTMENT_OTHER): Payer: Medicaid Other

## 2016-11-16 DIAGNOSIS — C9200 Acute myeloblastic leukemia, not having achieved remission: Secondary | ICD-10-CM | POA: Diagnosis not present

## 2016-11-16 LAB — COMPREHENSIVE METABOLIC PANEL
ALT: 24 U/L (ref 0–55)
AST: 25 U/L (ref 5–34)
Albumin: 3.9 g/dL (ref 3.5–5.0)
Alkaline Phosphatase: 109 U/L (ref 40–150)
Anion Gap: 8 mEq/L (ref 3–11)
BUN: 16.9 mg/dL (ref 7.0–26.0)
CHLORIDE: 105 meq/L (ref 98–109)
CO2: 30 meq/L — AB (ref 22–29)
Calcium: 9.9 mg/dL (ref 8.4–10.4)
Creatinine: 0.9 mg/dL (ref 0.6–1.1)
GLUCOSE: 100 mg/dL (ref 70–140)
POTASSIUM: 4.2 meq/L (ref 3.5–5.1)
SODIUM: 143 meq/L (ref 136–145)
TOTAL PROTEIN: 7.6 g/dL (ref 6.4–8.3)
Total Bilirubin: 1.16 mg/dL (ref 0.20–1.20)

## 2016-11-16 LAB — CBC WITH DIFFERENTIAL/PLATELET
BASO%: 0 % (ref 0.0–2.0)
Basophils Absolute: 0 10*3/uL (ref 0.0–0.1)
EOS ABS: 0 10*3/uL (ref 0.0–0.5)
EOS%: 3.4 % (ref 0.0–7.0)
HCT: 28.1 % — ABNORMAL LOW (ref 34.8–46.6)
HGB: 9.7 g/dL — ABNORMAL LOW (ref 11.6–15.9)
LYMPH%: 95.1 % — AB (ref 14.0–49.7)
MCH: 30.3 pg (ref 25.1–34.0)
MCHC: 34.5 g/dL (ref 31.5–36.0)
MCV: 87.8 fL (ref 79.5–101.0)
MONO#: 0 10*3/uL — ABNORMAL LOW (ref 0.1–0.9)
MONO%: 1 % (ref 0.0–14.0)
NEUT%: 0.5 % — ABNORMAL LOW (ref 38.4–76.8)
NEUTROS ABS: 0 10*3/uL — AB (ref 1.5–6.5)
Platelets: 9 10*3/uL — CL (ref 145–400)
RBC: 3.2 10*6/uL — AB (ref 3.70–5.45)
RDW: 14.5 % (ref 11.2–14.5)
WBC: 0.3 10*3/uL — AB (ref 3.9–10.3)
lymph#: 0.3 10*3/uL — ABNORMAL LOW (ref 0.9–3.3)

## 2016-11-16 LAB — MAGNESIUM: Magnesium: 1.9 mg/dl (ref 1.5–2.5)

## 2016-11-16 NOTE — Telephone Encounter (Signed)
Dr. Burr Medico notified of lab results today.  Per [facility] order, pt will need platelet transfusion for level less than 20.   Pt was scheduled for lab and Neulasta injection on 11/10/16.   Pt was  NO  SHOW for appts.  Spoke with pt, and asked pt reason for  NO SHOW.  Pt stated " I just got confused ".   Instructed pt to come in for office visit on 11/17/16, and to receive platelet transfusion after visit.  Gave pt date and time for appts 11/17/16.   Pt voiced understanding.

## 2016-11-17 ENCOUNTER — Other Ambulatory Visit: Payer: Medicaid Other

## 2016-11-17 ENCOUNTER — Ambulatory Visit (HOSPITAL_BASED_OUTPATIENT_CLINIC_OR_DEPARTMENT_OTHER): Payer: Medicaid Other

## 2016-11-17 ENCOUNTER — Other Ambulatory Visit: Payer: Self-pay | Admitting: *Deleted

## 2016-11-17 ENCOUNTER — Encounter: Payer: Self-pay | Admitting: Nurse Practitioner

## 2016-11-17 ENCOUNTER — Telehealth: Payer: Self-pay | Admitting: *Deleted

## 2016-11-17 ENCOUNTER — Ambulatory Visit (HOSPITAL_BASED_OUTPATIENT_CLINIC_OR_DEPARTMENT_OTHER): Payer: Medicaid Other | Admitting: Nurse Practitioner

## 2016-11-17 VITALS — BP 120/78 | HR 71 | Temp 98.3°F | Resp 16 | Ht 59.0 in | Wt 121.8 lb

## 2016-11-17 DIAGNOSIS — C9200 Acute myeloblastic leukemia, not having achieved remission: Secondary | ICD-10-CM | POA: Diagnosis present

## 2016-11-17 DIAGNOSIS — G43009 Migraine without aura, not intractable, without status migrainosus: Secondary | ICD-10-CM

## 2016-11-17 DIAGNOSIS — D696 Thrombocytopenia, unspecified: Secondary | ICD-10-CM | POA: Diagnosis not present

## 2016-11-17 DIAGNOSIS — Z5189 Encounter for other specified aftercare: Secondary | ICD-10-CM

## 2016-11-17 DIAGNOSIS — R110 Nausea: Secondary | ICD-10-CM

## 2016-11-17 DIAGNOSIS — D709 Neutropenia, unspecified: Secondary | ICD-10-CM

## 2016-11-17 DIAGNOSIS — G43909 Migraine, unspecified, not intractable, without status migrainosus: Secondary | ICD-10-CM

## 2016-11-17 MED ORDER — HEPARIN SOD (PORK) LOCK FLUSH 100 UNIT/ML IV SOLN
500.0000 [IU] | Freq: Once | INTRAVENOUS | Status: AC
  Administered 2016-11-17: 500 [IU] via INTRAVENOUS
  Filled 2016-11-17: qty 5

## 2016-11-17 MED ORDER — SODIUM CHLORIDE 0.9 % IV SOLN: 250.0000 mL | Freq: Once | INTRAVENOUS | Status: AC

## 2016-11-17 MED ORDER — ACETAMINOPHEN 325 MG PO TABS
650.0000 mg | ORAL_TABLET | Freq: Once | ORAL | Status: AC
  Administered 2016-11-17: 650 mg via ORAL

## 2016-11-17 MED ORDER — PEGFILGRASTIM INJECTION 6 MG/0.6ML ~~LOC~~
6.0000 mg | PREFILLED_SYRINGE | Freq: Once | SUBCUTANEOUS | Status: AC
  Administered 2016-11-17: 6 mg via SUBCUTANEOUS
  Filled 2016-11-17: qty 0.6

## 2016-11-17 MED ORDER — ONDANSETRON HCL 8 MG PO TABS
8.0000 mg | ORAL_TABLET | Freq: Once | ORAL | Status: AC
  Administered 2016-11-17: 8 mg via ORAL

## 2016-11-17 MED ORDER — SODIUM CHLORIDE 0.9% FLUSH
10.0000 mL | INTRAVENOUS | Status: AC | PRN
  Administered 2016-11-17: 10 mL
  Filled 2016-11-17: qty 10

## 2016-11-17 MED ORDER — SODIUM CHLORIDE 0.9 % IV SOLN
250.0000 mL | Freq: Once | INTRAVENOUS | Status: AC
  Administered 2016-11-17: 250 mL via INTRAVENOUS

## 2016-11-17 MED ORDER — ACETAMINOPHEN 325 MG PO TABS
ORAL_TABLET | ORAL | Status: AC
Start: 2016-11-17 — End: ?
  Filled 2016-11-17: qty 2

## 2016-11-17 MED ORDER — SODIUM CHLORIDE 0.9 % IV SOLN
1000.0000 mL | Freq: Once | INTRAVENOUS | Status: AC
  Administered 2016-11-17: 1000 mL via INTRAVENOUS

## 2016-11-17 MED ORDER — HEPARIN SOD (PORK) LOCK FLUSH 100 UNIT/ML IV SOLN
500.0000 [IU] | Freq: Every day | INTRAVENOUS | Status: AC | PRN
  Administered 2016-11-17: 500 [IU]
  Filled 2016-11-17: qty 5

## 2016-11-17 MED ORDER — SODIUM CHLORIDE 0.9% FLUSH
10.0000 mL | Freq: Once | INTRAVENOUS | Status: AC
Start: 2016-11-17 — End: 2016-11-17
  Administered 2016-11-17: 10 mL via INTRAVENOUS
  Filled 2016-11-17: qty 10

## 2016-11-17 MED ORDER — DIPHENHYDRAMINE HCL 25 MG PO CAPS
25.0000 mg | ORAL_CAPSULE | Freq: Once | ORAL | Status: AC
  Administered 2016-11-17: 25 mg via ORAL

## 2016-11-17 MED ORDER — DIPHENHYDRAMINE HCL 25 MG PO CAPS
ORAL_CAPSULE | ORAL | Status: AC
  Filled 2016-11-17: qty 1

## 2016-11-17 NOTE — Patient Instructions (Signed)
Dehydration, Adult Dehydration is a condition in which there is not enough fluid or water in the body. This happens when you lose more fluids than you take in. Important organs, such as the kidneys, brain, and heart, cannot function without a proper amount of fluids. Any loss of fluids from the body can lead to dehydration. Dehydration can range from mild to severe. This condition should be treated right away to prevent it from becoming severe. What are the causes? This condition may be caused by:  Vomiting.  Diarrhea.  Excessive sweating, such as from heat exposure or exercise.  Not drinking enough fluid, especially: ? When ill. ? While doing activity that requires a lot of energy.  Excessive urination.  Fever.  Infection.  Certain medicines, such as medicines that cause the body to lose excess fluid (diuretics).  Inability to access safe drinking water.  Reduced physical ability to get adequate water and food.  What increases the risk? This condition is more likely to develop in people:  Who have a poorly controlled long-term (chronic) illness, such as diabetes, heart disease, or kidney disease.  Who are age 65 or older.  Who are disabled.  Who live in a place with high altitude.  Who play endurance sports.  What are the signs or symptoms? Symptoms of mild dehydration may include:  Thirst.  Dry lips.  Slightly dry mouth.  Dry, warm skin.  Dizziness. Symptoms of moderate dehydration may include:  Very dry mouth.  Muscle cramps.  Dark urine. Urine may be the color of tea.  Decreased urine production.  Decreased tear production.  Heartbeat that is irregular or faster than normal (palpitations).  Headache.  Light-headedness, especially when you stand up from a sitting position.  Fainting (syncope). Symptoms of severe dehydration may include:  Changes in skin, such as: ? Cold and clammy skin. ? Blotchy (mottled) or pale skin. ? Skin that does  not quickly return to normal after being lightly pinched and released (poor skin turgor).  Changes in body fluids, such as: ? Extreme thirst. ? No tear production. ? Inability to sweat when body temperature is high, such as in hot weather. ? Very little urine production.  Changes in vital signs, such as: ? Weak pulse. ? Pulse that is more than 100 beats a minute when sitting still. ? Rapid breathing. ? Low blood pressure.  Other changes, such as: ? Sunken eyes. ? Cold hands and feet. ? Confusion. ? Lack of energy (lethargy). ? Difficulty waking up from sleep. ? Short-term weight loss. ? Unconsciousness. How is this diagnosed? This condition is diagnosed based on your symptoms and a physical exam. Blood and urine tests may be done to help confirm the diagnosis. How is this treated? Treatment for this condition depends on the severity. Mild or moderate dehydration can often be treated at home. Treatment should be started right away. Do not wait until dehydration becomes severe. Severe dehydration is an emergency and it needs to be treated in a hospital. Treatment for mild dehydration may include:  Drinking more fluids.  Replacing salts and minerals in your blood (electrolytes) that you may have lost. Treatment for moderate dehydration may include:  Drinking an oral rehydration solution (ORS). This is a drink that helps you replace fluids and electrolytes (rehydrate). It can be found at pharmacies and retail stores. Treatment for severe dehydration may include:  Receiving fluids through an IV tube.  Receiving an electrolyte solution through a feeding tube that is passed through your nose   and into your stomach (nasogastric tube, or NG tube).  Correcting any abnormalities in electrolytes.  Treating the underlying cause of dehydration. Follow these instructions at home:  If directed by your health care provider, drink an ORS: ? Make an ORS by following instructions on the  package. ? Start by drinking small amounts, about  cup (120 mL) every 5-10 minutes. ? Slowly increase how much you drink until you have taken the amount recommended by your health care provider.  Drink enough clear fluid to keep your urine clear or pale yellow. If you were told to drink an ORS, finish the ORS first, then start slowly drinking other clear fluids. Drink fluids such as: ? Water. Do not drink only water. Doing that can lead to having too little salt (sodium) in the body (hyponatremia). ? Ice chips. ? Fruit juice that you have added water to (diluted fruit juice). ? Low-calorie sports drinks.  Avoid: ? Alcohol. ? Drinks that contain a lot of sugar. These include high-calorie sports drinks, fruit juice that is not diluted, and soda. ? Caffeine. ? Foods that are greasy or contain a lot of fat or sugar.  Take over-the-counter and prescription medicines only as told by your health care provider.  Do not take sodium tablets. This can lead to having too much sodium in the body (hypernatremia).  Eat foods that contain a healthy balance of electrolytes, such as bananas, oranges, potatoes, tomatoes, and spinach.  Keep all follow-up visits as told by your health care provider. This is important. Contact a health care provider if:  You have abdominal pain that: ? Gets worse. ? Stays in one area (localizes).  You have a rash.  You have a stiff neck.  You are more irritable than usual.  You are sleepier or more difficult to wake up than usual.  You feel weak or dizzy.  You feel very thirsty.  You have urinated only a small amount of very dark urine over 6-8 hours. Get help right away if:  You have symptoms of severe dehydration.  You cannot drink fluids without vomiting.  Your symptoms get worse with treatment.  You have a fever.  You have a severe headache.  You have vomiting or diarrhea that: ? Gets worse. ? Does not go away.  You have blood or green matter  (bile) in your vomit.  You have blood in your stool. This may cause stool to look black and tarry.  You have not urinated in 6-8 hours.  You faint.  Your heart rate while sitting still is over 100 beats a minute.  You have trouble breathing. This information is not intended to replace advice given to you by your health care provider. Make sure you discuss any questions you have with your health care provider. Document Released: 02/07/2005 Document Revised: 09/04/2015 Document Reviewed: 04/03/2015 Elsevier Interactive Patient Education  2018 [employer].   Platelet Transfusion A platelet transfusion is a procedure in which you receive donated platelets through an IV tube. Platelets are tiny pieces of blood cells. When a blood vessel is damaged, platelets collect in the damaged area to help form a blood clot. This begins the healing process. If your platelet count gets too low, your blood may have trouble clotting. You may need a platelet transfusion if you have a condition that causes a low number of platelets (thrombocytopenia). A platelet transfusion may be used to stop or prevent bleeding. Tell a health care provider about:  Any allergies you have.  All medicines you are taking, including vitamins, herbs, eye drops, creams, and over-the-counter medicines.  Any problems you or family members have had with anesthetic medicines.  Any blood disorders you have.  Any surgeries you have had.  Any medical conditions you have.  Any reactions you have had during a previous transfusion. What are the risks? Generally, this is a safe procedure. However, problems may occur, including:  Fever with or without chills. The fever usually occurs within the first 4 hours of the transfusion and returns to normal within 48 hours.  Allergic reaction. The reaction is most commonly caused by antibodies your body creates against substances in the transfusion. Signs of an allergic reaction may include  itching, hives, difficulty breathing, shock, or low blood pressure.  Sudden (acute) or delayed hemolytic reaction. This rare reaction can occur during the transfusion and up to 28 days after the transfusion. The reaction usually occurs when your body's defense system (immune system) attacks the new platelets. Signs of a hemolytic reaction may include fever, headache, difficulty breathing, low blood pressure, a rapid heartbeat, or pain in your back, abdomen, chest, or IV site.  Transfusion-related acute lung injury (TRALI). TRALI can occur within hours of a transfusion, or several days later. This is a rare reaction that causes lung damage. The cause is not known.  Infection. Signs of this rare complication may include fever, chills, vomiting, a rapid heartbeat, or low blood pressure.  What happens before the procedure?  You may have a blood test to determine your blood type. This is necessary to find out what kind ofplatelets best matches your platelets.  If you have had an allergic reaction to a transfusion in the past, you may be given medicine to help prevent a reaction. Take this medicine only as directed by your health care provider.  Your temperature, blood pressure, and pulse will be monitored before the transfusion. What happens during the procedure?  An IV will be started in your hand or arm.  The transfusion will be attached to your IV tubing. The bag of donated platelets will be attached to your IV tube andgiven into your vein.  Your temperature, blood pressure, and pulse will be monitored regularly during the transfusion. This monitoring is done to help detect early signs of a transfusion reaction.  If you have any signs or symptoms of a reaction, your transfusion will be stopped and you may be given medicine.  When your transfusion is complete, your IV will be removed.  Pressure may be applied to the IV site for a few minutes.  A bandage (dressing) will be applied. The  procedure may vary among health care providers and hospitals. What happens after the procedure?  Your blood pressure, temperature, and pulse will be monitored regularly. This information is not intended to replace advice given to you by your health care provider. Make sure you discuss any questions you have with your health care provider. Document Released: 12/05/2006 Document Revised: 07/16/2015 Document Reviewed: 12/18/2013 Elsevier Interactive Patient Education  2018 [employer].

## 2016-11-17 NOTE — Telephone Encounter (Signed)
Spoke with Valinda Party Triage Nurse , and informed her of pt's NO SHOW for lab and Neulasta injection on  11/10/16 as scheduled.  Pt also failed to show up for lab on 9/24.  Informed Yolanda of pt's CBC results done 11/16/16.   Informed Denman George that pt is currently here at the clinic for platelet transfusion and to have office visit with Dr. Burr Medico as well.  Denman George stated she would relay message to Dr. Florene Glen and Darliss Cheney, RN.

## 2016-11-17 NOTE — Progress Notes (Addendum)
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 - SEE COMMENT. PERIPHERAL BLOOD: - PANCYTOPENIA.      05/10/2016 Initial Biopsy    Bone Marrow Flow Cytometry 05/10/16 - ACUTE MYELOID LEUKEMIA, SEE COMMENT.      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 See comment. PERIPHERAL BLOOD: Pancytopenia. No circulating blasts identified. See comment. Normal cytogenetics; FLT-3 negative, STAG2 positiv      06/01/2016 - 06/07/2016 Chemotherapy    The patient had induction chemotherapy with cytarabine and daunorubicin (7+3).      06/14/2016 Pathology Results    BONE MARROW: Marked panhypoplasia. Rare CD34 positive blasts identified. See comment. PERIPHERAL BLOOD: Marked pancytopenia Circulating blasts not identified See CBC data and comment < 10% cellularity and < 1% blasts although with rare blasts by CD34 concerning for MRD.      06/19/2016 Imaging    Pulmonary Chest CT revealed a 68m solid pulmonary nodule with recommendation for repeat CT in 1 year; [facility]~12[facility]19/16).      06/20/2016 Procedure    Cardiac Last TTE 06/20/16 with EF of 60-65%.      07/05/2016 Pathology Results    BONE MARROW: Normocellular bone marrow (60%) with trilineage hematopoiesis and maturation. No evidence of acute myeloid leukemia. See comment. PERIPHERAL BLOOD: Mild  normocytic anemia. Rare circulating blasts identified. See CBC data and comment. Consistent with remission with normal cytogenetics      07/26/2016 -  Chemotherapy    [facility] (3gm/m2) IV q12h on days 1, 2 and 3 for 6 doses. Plan is for 3 cycles with Hidac if the patient does not proceed with transplant. She [facility] The patient would be recommended for evaluation for possible allogeneic stem cell transplant by Dr. HNadara Mustardon 08/22/16. Of note, the patient has only has half siblings, but also has 5 children so she will need unrelated NMDP search or consideration for haplo transplant.      07/26/2016 - 07/29/2016 Hospital Admission    Admit date: 07/26/16 Admission diagnosis:  Acute Lymphoid Leukemia Additional comments: The patient completed her first cycle of HiDAC 3 g/m2 on days 1-3 q12hrs during admission without complication.          HISTORY OF PRESENTING ILLNESS:  Morgan NMADIHA BAMBRICK89y.o. female who presented to the ED on 04/29/16 for fatigue and diffuse intermittent bruising to her bilateral legs and right forearm that started 1 month prior. CBC revealed Hb 7.0, low WBC, RBC, HCT, and elevated RDW. Platelets were low at 54K. Blood smear revealed pancytopenia including neutropenia. 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 I was subsequently contacted and I requested 1 unit of blood, iron work up, and a follow up.  The patient has a history of anemia stretching back to February 2010, the oldest lab results provided. She had an Hb  11.9, Pl 268K, and ANC 12.9 on 04/20/2008. Hb 10.5 on 09/25/2015.  In regards to a family history of anemia ,she reports her daughter had anemia years ago and had to go to [place]. She does not know/remember what it was. Her daughter is a young adult now.  At this time, she reports  occasional fatigue. She takes naps when she does have fatigue. She reports occasional night sweats. She had a partial hysterectomy years ago and did not have a menstrual cycle since. Denies fever or pruritus. She reports ecchymoses on her bilateral lower extremities with none on her arms. She denies recent chest pain on exertion, shortness of breath on minimal exertion, pre-syncopal episodes, or palpitations. She had not noticed any recent bleeding such as epistaxis, hematuria or hematochezia. The patient only takes over the counter NSAIDs for muscle pain PRN. She never had a colonoscopy. She had no prior history or diagnosis of cancer. She denies any pica and eats a variety of diet. She never donated blood. She received a blood transfusion on 04/30/16. The patient is not prescribed any oral iron supplements.  Current Therapy:  The [facility] for first consolidation using Hidac (3gm/m2) IV q12h on days 1, 2 and 3 for 6 doses. Plan is for 3 cycles with Hidac if the patient does not proceed with transplant. S/p cycle 3 Hidac on 11/04/16  INTERVAL HISTORY: Morgan Waters was seen today while receiving a platelet transfusion. She is status post HIDAC for consolidation, cycle 1 on 07/26/2016, cycle 2 on 09/09/2016, and cycle 3 on 11/04/2016 at [place] were drawn at our facility yesterday, hemoglobin is 9.7, but she did not stay for blood transfusion. She was also found to be neutropenic, ANC 0.0, and thrombocytopenic with platelets 9k. She was called back to receive platelets today. Her main concern today is a migraine headache, 10/10 pain with photophobia. She takes Aleve for her history of migraines. She denies fever or chills, pain or redness at her Port-A-Cath site, bleeding, shortness of breath, cough with sputum, diarrhea, or dysuria.  REVIEW OF SYSTEMS:   Constitutional: Denies fevers, chills or abnormal weight loss. (+)  Fatigue Eyes: Denies blurriness of vision. Denies redness or itching Ears, nose, mouth, throat, and face: Denies mucositis or sore throat Respiratory: Denies cough, dyspnea or wheezes Cardiovascular: Denies palpitation, chest discomfort or lower extremity swelling Gastrointestinal:  Denies nausea, vomiting, constipation, diarrhea, heartburn or change in bowel habits Skin: Denies abnormal skin rashes Lymphatics: Denies new lymphadenopathy or easy bruising Neurological:Denies numbness, tingling or new weaknesses. (+) Migraine headache (+) photophobia Behavioral/Psych: Mood is stable, no new changes  All other systems were reviewed with the patient and are negative.  MEDICAL HISTORY:  Past Medical History:  Diagnosis Date  . Migraine    SURGICAL HISTORY: Past Surgical History:  Procedure Laterality Date  . ABDOMINAL HYSTERECTOMY    . CESAREAN SECTION     I have reviewed the social history and family history with the patient and they are unchanged from previous note.  ALLERGIES:  has No Known Allergies.  MEDICATIONS:  Current Outpatient Prescriptions  Medication Sig Dispense Refill  . acyclovir (ZOVIRAX) 400 MG tablet Take 400 mg by mouth daily.  5  . fluconazole (DIFLUCAN) 200 MG tablet Take 1 tablet by mouth daily.    Marland Kitchen ibuprofen (ADVIL,MOTRIN) 200 MG tablet Take 200 mg by mouth every 6 (six) hours as needed.    Marland Kitchen levofloxacin (LEVAQUIN) 500 MG tablet Take  1 tablet by mouth daily.    Marland Kitchen nystatin (MYCOSTATIN) 100000 UNIT/ML suspension Take 5 mLs (500,000 Units total) by mouth 4 (four) times daily. 500 mL 0  . polyvinyl alcohol (LIQUIFILM TEARS) 1.4 % ophthalmic solution Place 2 drops into both eyes every 4 (four) hours.    . prednisoLONE acetate (PRED FORTE) 1 % ophthalmic suspension Place 2 drops into both eyes every 4 (four) hours.    . promethazine (PHENERGAN) 25 MG tablet Take 50 mg by mouth daily.    Marland Kitchen senna-docusate (SENOKOT-S) 8.6-50 MG tablet Take 1 tablet by mouth as  needed.      No current facility-administered medications for this visit.    Facility-Administered Medications Ordered in Other Visits  Medication Dose Route Frequency Provider Last Rate Last Dose  . 0.9 %  sodium chloride infusion  250 mL Intravenous Once Truitt Merle, MD      . 0.9 %  sodium chloride infusion  250 mL Intravenous Once Truitt Merle, MD      . acetaminophen (TYLENOL) tablet 650 mg  650 mg Oral Once Truitt Merle, MD      . diphenhydrAMINE (BENADRYL) capsule 25 mg  25 mg Oral Once Truitt Merle, MD      . heparin lock flush 100 unit/mL  500 Units Intracatheter Daily PRN Truitt Merle, MD      . heparin lock flush 100 unit/mL  250 Units Intracatheter PRN Truitt Merle, MD      . sodium chloride flush (NS) 0.9 % injection 10 mL  10 mL Intracatheter PRN Truitt Merle, MD      . sodium chloride flush (NS) 0.9 % injection 3 mL  3 mL Intracatheter PRN Truitt Merle, MD       PHYSICAL EXAMINATION: ECOG PERFORMANCE STATUS: 2 - Symptomatic, <50% confined to bed  Vitals:   11/17/16 1155  BP: 120/78  Pulse: 71  Resp: 16  Temp: 98.3 F (36.8 C)  SpO2: 100%   Filed Weights   11/17/16 1155  Weight: 121 lb 12.8 oz (55.2 kg)   GENERAL: Eyes closed, in no distress. Visibly and pain from headache SKIN: skin color, texture, turgor are normal, no rashes or significant lesions EYES: normal, Conjunctiva are pink and non-injected, sclera clear OROPHARYNX:no exudate, no erythema and lips, buccal mucosa, and tongue normal  NECK: supple, thyroid normal size, non-tender, without nodularity LYMPH:  no palpable lymphadenopathy in the cervical, axillary or inguinal LUNGS: clear to auscultation bilaterally with normal breathing effort HEART: regular rate & rhythm, S1 and S2 present, no murmurs and no lower extremity edema ABDOMEN:abdomen soft, non-tender and normal bowel sounds. No palpable hepatomegaly or masses Musculoskeletal:no cyanosis of digits and no clubbing  NEURO: alert & oriented x 3 with fluent speech, no  focal motor/sensory deficits Port-A-Cath site without erythema  LABORATORY DATA:  I have reviewed the data as listed CBC Latest Ref Rng & Units 11/16/2016 10/27/2016 10/26/2016  WBC 3.9 - 10.3 10e3/uL 0.3(LL) 7.5 6.6  Hemoglobin 11.6 - 15.9 g/dL 9.7(L) 12.1 12.1  Hematocrit 34.8 - 46.6 % 28.1(L) 36.4 36.5  Platelets 145 - 400 10e3/uL 9(LL) 191 192   CMP Latest Ref Rng & Units 11/16/2016 10/27/2016 10/26/2016  Glucose 70 - 140 mg/dl 100 83 98  BUN 7.0 - 26.0 mg/dL 16.9 17.3 17.8  Creatinine 0.6 - 1.1 mg/dL 0.9 0.9 0.9  Sodium 136 - 145 mEq/L 143 143 144  Potassium 3.5 - 5.1 mEq/L 4.2 4.0 4.1  Chloride 101 - 111 mmol/L - - -  CO2 22 - 29 mEq/L 30(H) 25 26  Calcium 8.4 - 10.4 mg/dL 9.9 9.5 9.8  Total Protein 6.4 - 8.3 g/dL 7.6 7.5 7.3  Total Bilirubin 0.20 - 1.20 mg/dL 1.16 0.63 0.57  Alkaline Phos 40 - 150 U/L 109 92 96  AST 5 - 34 U/L 25 25 26   ALT 0 - 55 U/L 24 16 16    Results for NAYDEEN, SPEIRS (MRN [id number]) as of 05/04/2016 14:12  Ref. Range 04/29/2016 21:55  LDH Latest Ref Range: 98 - 192 U/L 203 (H)  Ferritin Latest Ref Range: 11 - 307 ng/mL 76  Folate Latest Ref Range: >5.9 ng/mL 7.8  Vitamin B12 Latest Ref Range: 180 - 914 pg/mL 340   Absolute reticulocyte count 34.6 Heptoglobin normal  PATHOLOGY Diagnosis 05/10/16 Bone Marrow, Aspirate,Biopsy, and Clot, right iliac - ACUTE MYELOID LEUKEMIA. - SEE COMMENT. PERIPHERAL BLOOD: - PANCYTOPENIA.  Interpretation 05/10/16 Bone Marrow Flow Cytometry - ACUTE MYELOID LEUKEMIA, SEE COMMENT.  PERIPHERAL BLOOD: - PANCYTOPENIA. Diagnosis Note The marrow is mildly hypercellular with increased blasts (25% aspirate, 37% flow cytometry, 30% CD34). There is background dysplasia. Overall, the findings are consistent with acute myeloid leukemia. Dr. Burr Medico was notified on 05/11/2016.  RADIOGRAPHIC STUDIES: I have personally reviewed the radiological images as listed and agreed with the findings in the report. No results found.    ASSESSMENT & PLAN:   1. 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, STAG-2 positive -Status post induction chemotherapy with cytarabine and daunorubicin (7+3) on 06/01/2016 -[place] bone marrow biopsy with less than 10% cellularity and less than 1% blasts although with rare blasts by CD34 concerning for MRD -Recovery bone marrow biopsy consistent with remission and normal cytogenetics on 07/05/2016 -Status post consolidation chemotherapy with HIDAC cycle 1 07/26/2016, cycle 2 09/09/2016, cycle 3 11/04/2016 -Current labs as of 11/16/2016 reveal pancytopenia, ANC 0.0, hemoglobin 9.7, platelets 9k -She will receive 1 unit of platelets today with Neulasta; she will also receive IV fluids for decreased PO intake and IV antiemetics due to vomiting during platelet transfusion -She is scheduled to return twice weekly for the next 4 weeks to monitor counts, we have scheduled anticipatory transfusion appointments that can be cancelled if needed.  -She is taking prophylactic Diflucan and Levaquin daily while neutropenic in addition to daily acyclovir; Medical oncology team will determine when to stop neutropenic precautions -Per [facility], she will receive 1 unit plt if plt <20 and 2 units RBC's if hgb <9; all blood products to be irradiated  -continue lab twice a week for the next 2-3 weeks until blood counts recover   2. Migraine headache -History of chronic migraine headaches -She currently takes Aleve, has never been on prophylactic for abortive medication -She is instructed to avoid Aleve while thrombocytopenic and cautioned to avoid Tylenol while neutropenic so as not to mask a fever  3. Nausea and vomiting  -possible related to her severe headache today -She received IV fluids and antiemetics today  -she knows to call us if she has persistent headaches, nausea, or fever.  Plan  -Platelet transfusion today, neulasta injection today  -IV fluids and antiemetics today and symptom  management clinic  -Return 11/21/2016 for lab and possible transfusion  -Scheduled every Monday and Tuesday x4 weeks for labs and transfusions PRN  All questions were answered. The patient knows to call the clinic with any problems, questions or concerns. No barriers to learning was detected.  I spent 20 counseling the patient face to face. The total time spent  in the appointment was 25 and more than 50% was on counseling and review of test results     Alla Feeling, NP 11/17/16   I have seen the patient, examined her. I agree with the assessment and and plan and have edited the notes.   Truitt Merle  11/17/2016

## 2016-11-17 NOTE — Progress Notes (Unsigned)
Pt here for platelet transfusion.  Arrived c/o headache. Pt states she took pain meds prior to coming to cancer center.  Tolerated transfusion well.. However, after port deaccessed pt stated she was feeling very nauseated and then vomited large amount of stomach contents. Dr. Burr Medico made aware.  Pt now states she has not had much to eat today or fluids.  Received order for IV fluids and SL zofran.  Port re-accessed and pt given 1 liter over 2 hours.  Resting comfortably during infusion.  No further episodes of nausea.

## 2016-11-18 ENCOUNTER — Emergency Department (HOSPITAL_COMMUNITY): Payer: Medicaid Other

## 2016-11-18 ENCOUNTER — Inpatient Hospital Stay (HOSPITAL_COMMUNITY)
Admission: EM | Admit: 2016-11-18 | Discharge: 2016-11-23 | DRG: 871 | Disposition: A | Discharge status: Home / Self Care | Payer: Medicaid Other | Attending: Internal Medicine | Admitting: Internal Medicine

## 2016-11-18 ENCOUNTER — Encounter (HOSPITAL_COMMUNITY): Payer: Self-pay | Admitting: Emergency Medicine

## 2016-11-18 DIAGNOSIS — C9200 Acute myeloblastic leukemia, not having achieved remission: Secondary | ICD-10-CM | POA: Diagnosis present

## 2016-11-18 DIAGNOSIS — C9201 Acute myeloblastic leukemia, in remission: Secondary | ICD-10-CM | POA: Diagnosis present

## 2016-11-18 DIAGNOSIS — I959 Hypotension, unspecified: Secondary | ICD-10-CM

## 2016-11-18 DIAGNOSIS — Z79899 Other long term (current) drug therapy: Secondary | ICD-10-CM

## 2016-11-18 DIAGNOSIS — R509 Fever, unspecified: Secondary | ICD-10-CM

## 2016-11-18 DIAGNOSIS — J189 Pneumonia, unspecified organism: Secondary | ICD-10-CM | POA: Diagnosis not present

## 2016-11-18 DIAGNOSIS — K529 Noninfective gastroenteritis and colitis, unspecified: Secondary | ICD-10-CM | POA: Diagnosis present

## 2016-11-18 DIAGNOSIS — R197 Diarrhea, unspecified: Secondary | ICD-10-CM

## 2016-11-18 DIAGNOSIS — E860 Dehydration: Secondary | ICD-10-CM | POA: Diagnosis present

## 2016-11-18 DIAGNOSIS — D61818 Other pancytopenia: Secondary | ICD-10-CM | POA: Diagnosis present

## 2016-11-18 DIAGNOSIS — D61810 Antineoplastic chemotherapy induced pancytopenia: Secondary | ICD-10-CM | POA: Diagnosis present

## 2016-11-18 DIAGNOSIS — T451X5A Adverse effect of antineoplastic and immunosuppressive drugs, initial encounter: Secondary | ICD-10-CM | POA: Diagnosis present

## 2016-11-18 DIAGNOSIS — N179 Acute kidney failure, unspecified: Secondary | ICD-10-CM | POA: Diagnosis present

## 2016-11-18 DIAGNOSIS — R112 Nausea with vomiting, unspecified: Secondary | ICD-10-CM

## 2016-11-18 DIAGNOSIS — G43909 Migraine, unspecified, not intractable, without status migrainosus: Secondary | ICD-10-CM | POA: Diagnosis present

## 2016-11-18 DIAGNOSIS — A419 Sepsis, unspecified organism: Principal | ICD-10-CM | POA: Diagnosis present

## 2016-11-18 HISTORY — DX: Leukemia, unspecified not having achieved remission: C95.90

## 2016-11-18 LAB — COMPREHENSIVE METABOLIC PANEL
ALT: 24 U/L (ref 14–54)
AST: 26 U/L (ref 15–41)
Albumin: 3.5 g/dL (ref 3.5–5.0)
Alkaline Phosphatase: 89 U/L (ref 38–126)
Anion gap: 11 (ref 5–15)
BILIRUBIN TOTAL: 2.2 mg/dL — AB (ref 0.3–1.2)
BUN: 21 mg/dL — AB (ref 6–20)
CO2: 23 mmol/L (ref 22–32)
CREATININE: 1.47 mg/dL — AB (ref 0.44–1.00)
Calcium: 8.8 mg/dL — ABNORMAL LOW (ref 8.9–10.3)
Chloride: 103 mmol/L (ref 101–111)
GFR calc Af Amer: 47 mL/min — ABNORMAL LOW (ref >60)
GFR, EST NON AFRICAN AMERICAN: 41 mL/min — AB (ref >60)
Glucose, Bld: 145 mg/dL — ABNORMAL HIGH (ref 65–99)
Potassium: 4.1 mmol/L (ref 3.5–5.1)
Sodium: 137 mmol/L (ref 135–145)
Total Protein: 7.3 g/dL (ref 6.5–8.1)

## 2016-11-18 LAB — PROTIME-INR
INR: 1.42
Prothrombin Time: 17.3 seconds — ABNORMAL HIGH (ref 11.4–15.2)

## 2016-11-18 LAB — BPAM PLATELET PHERESIS
BLOOD PRODUCT EXPIRATION DATE: 201809271321
ISSUE DATE / TIME: 201809271210
Unit Type and Rh: 9500

## 2016-11-18 LAB — I-STAT CG4 LACTIC ACID, ED: Lactic Acid, Venous: 2.41 mmol/L (ref 0.5–1.9)

## 2016-11-18 LAB — PREPARE PLATELET PHERESIS: Unit division: 0

## 2016-11-18 LAB — LIPASE, BLOOD: LIPASE: 15 U/L (ref 11–51)

## 2016-11-18 MED ORDER — PIPERACILLIN-TAZOBACTAM 3.375 G IVPB 30 MIN
3.3750 g | Freq: Once | INTRAVENOUS | Status: AC
  Administered 2016-11-19: 3.375 g via INTRAVENOUS
  Filled 2016-11-18: qty 50

## 2016-11-18 MED ORDER — SODIUM CHLORIDE 0.9 % IV BOLUS (SEPSIS)
250.0000 mL | Freq: Once | INTRAVENOUS | Status: AC
Start: 2016-11-18 — End: 2016-11-19
  Administered 2016-11-18: 250 mL via INTRAVENOUS

## 2016-11-18 MED ORDER — SODIUM CHLORIDE 0.9 % IV BOLUS (SEPSIS)
1000.0000 mL | Freq: Once | INTRAVENOUS | Status: AC
  Administered 2016-11-19: 1000 mL via INTRAVENOUS

## 2016-11-18 MED ORDER — SODIUM CHLORIDE 0.9 % IV SOLN
1000.0000 mL | INTRAVENOUS | Status: DC
  Administered 2016-11-19 – 2016-11-21 (×4): 1000 mL via INTRAVENOUS

## 2016-11-18 MED ORDER — SODIUM CHLORIDE 0.9 % IV BOLUS (SEPSIS)
500.0000 mL | Freq: Once | INTRAVENOUS | Status: AC
  Administered 2016-11-19: 500 mL via INTRAVENOUS

## 2016-11-18 MED ORDER — VANCOMYCIN HCL IN DEXTROSE 1-5 GM/200ML-% IV SOLN
1000.0000 mg | Freq: Once | INTRAVENOUS | Status: AC
  Administered 2016-11-19: 1000 mg via INTRAVENOUS
  Filled 2016-11-18: qty 200

## 2016-11-18 NOTE — ED Provider Notes (Signed)
[place] DEPT Provider Note   CSN: [id number] Arrival date & time: 11/18/16  2045     History   Chief Complaint Chief Complaint  Patient presents with  . Emesis  . Diarrhea    HPI Morgan Waters is a 51 y.o. female.   Emesis   Associated symptoms include diarrhea.  Diarrhea   Associated symptoms include vomiting.    Pt was seen at 2225. Per pt and her family, c/o gradual onset and persistence of multiple intermittent episodes of N/V/D that began yesterday afternoon.   Describes the stools as "watery."  Pt's family states pt's LD chemo was 2 weeks ago, and pt had platelet transfusion yesterday. Denies abd pain, no CP/SOB, no back pain, no fevers, no black or blood in stools or emesis.    Past Medical History:  Diagnosis Date  . Leukemia ([place])   . Migraine     Patient Active Problem List   Diagnosis Date Noted  . Migraine 11/17/2016  . 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 Other pancytopenia ([place]) 05/04/2016    Past Surgical History:  Procedure Laterality Date  . ABDOMINAL HYSTERECTOMY    . CESAREAN SECTION      OB History    No data available       Home Medications    Prior to Admission medications   Medication Sig Start Date End Date Taking? Authorizing Provider  acyclovir (ZOVIRAX) 400 MG tablet Take 400 mg by mouth daily. 07/29/16   [provider]  fluconazole (DIFLUCAN) 200 MG tablet Take 1 tablet by mouth daily. 07/29/16   [provider]  ibuprofen (ADVIL,MOTRIN) 200 MG tablet Take 200 mg by mouth every 6 (six) hours as needed.    [provider]  levofloxacin (LEVAQUIN) 500 MG tablet Take 1 tablet by mouth daily. 07/29/16   [provider]  nystatin (MYCOSTATIN) 100000 UNIT/ML suspension Take 5 mLs (500,000 Units total) by mouth 4 (four) times daily. 08/04/16   Truitt Merle, MD  polyvinyl alcohol (LIQUIFILM TEARS) 1.4 % ophthalmic solution Place 2 drops into both eyes every 4 (four) hours. 07/29/16    [provider]  prednisoLONE acetate (PRED FORTE) 1 % ophthalmic suspension Place 2 drops into both eyes every 4 (four) hours. 07/29/16   [provider]  promethazine (PHENERGAN) 25 MG tablet Take 50 mg by mouth daily. 06/29/16   [provider]  senna-docusate (SENOKOT-S) 8.6-50 MG tablet Take 1 tablet by mouth as needed.  06/29/16   [provider]    Family History Family History  Problem Relation Age of Onset  . Stroke Father   . Anemia Daughter     Social History Social History  Substance Use Topics  . Smoking status: Never Smoker  . Smokeless tobacco: Never Used  . Alcohol use No     Allergies   Patient has no known allergies.   Review of Systems Review of Systems  Gastrointestinal: Positive for diarrhea and vomiting.  ROS: Statement: All systems negative except as marked or noted in the HPI; Constitutional: Negative for fever and chills. ; ; Eyes: Negative for eye pain, redness and discharge. ; ; ENMT: Negative for ear pain, hoarseness, nasal congestion, sinus pressure and sore throat. ; ; Cardiovascular: Negative for chest pain, palpitations, diaphoresis, dyspnea and peripheral edema. ; ; Respiratory: Negative for cough, wheezing and stridor. ; ; Gastrointestinal: +N/V/D. Negative for abdominal pain, blood in stool, hematemesis, jaundice and rectal bleeding. . ; ; Genitourinary: Negative for dysuria,  flank pain and hematuria. ; ; Musculoskeletal: Negative for back pain and neck pain. Negative for swelling and trauma.; ; Skin: Negative for pruritus, rash, abrasions, blisters, bruising and skin lesion.; ; Neuro: Negative for headache, lightheadedness and neck stiffness. Negative for weakness, altered level of consciousness, altered mental status, extremity weakness, paresthesias, involuntary movement, seizure and syncope.      Physical Exam Updated Vital Signs BP (!) 82/60 (BP Location: Left Arm)   Pulse (!) 102   Resp (!) 24   SpO2 100%     Physical Exam 2230: Physical examination:  Nursing notes reviewed; Vital signs and O2 SAT reviewed;  Constitutional: Well developed, Well nourished, In no acute distress; Head:  Normocephalic, atraumatic; Eyes: EOMI, PERRL, No scleral icterus; ENMT: Mouth and pharynx normal, Mucous membranes dry; Neck: Supple, Full range of motion, No lymphadenopathy; Cardiovascular: Tachycardic rate and rhythm, No gallop; Respiratory: Breath sounds clear & equal bilaterally, No wheezes.  Speaking full sentences with ease, Normal respiratory effort/excursion; Chest: Nontender, Movement normal; Abdomen: Soft, Nontender, Nondistended, Normal bowel sounds; Genitourinary: No CVA tenderness; Extremities: Pulses normal, No tenderness, No edema, No calf edema or asymmetry.; Neuro: AA&Ox3, Major CN grossly intact.  Speech clear. No gross focal motor or sensory deficits in extremities.; Skin: Color normal, Warm, Dry.   ED Treatments / Results  Labs (all labs ordered are listed, but only abnormal results are displayed)   EKG  EKG Interpretation None       Radiology   Procedures Procedures (including critical care time)  Medications Ordered in ED Medications  0.9 %  sodium chloride infusion (not administered)  sodium chloride 0.9 % bolus 1,000 mL (not administered)    And  sodium chloride 0.9 % bolus 500 mL (not administered)    And  sodium chloride 0.9 % bolus 250 mL (not administered)  piperacillin-tazobactam (ZOSYN) IVPB 3.375 g (not administered)  vancomycin (VANCOCIN) IVPB 1000 mg/200 mL premix (not administered)     Initial Impression / Assessment and Plan / ED Course  I have reviewed the triage vital signs and the nursing notes.  Pertinent labs & imaging results that were available during my care of the patient were reviewed by me and considered in my medical decision making (see chart for details).  MDM Reviewed: previous chart, nursing note and vitals Reviewed previous:  labs Interpretation: labs, ECG and x-ray Total time providing critical care: 30-74 minutes. This excludes time spent performing separately reportable procedures and services.    CRITICAL CARE Performed by: Alfonzo Feller Total critical care time: 35 minutes Critical care time was exclusive of separately billable procedures and treating other patients. Critical care was necessary to treat or prevent imminent or life-threatening deterioration. Critical care was time spent personally by me on the following activities: development of treatment plan with patient and/or surrogate as well as nursing, discussions with consultants, evaluation of patient's response to treatment, examination of patient, obtaining history from patient or surrogate, ordering and performing treatments and interventions, ordering and review of laboratory studies, ordering and review of radiographic studies, pulse oximetry and re-evaluation of patient's condition.  ED ECG REPORT   Date: 11/19/2016  Rate: 92  Rhythm: normal sinus rhythm  QRS Axis: normal  Intervals: normal  ST/T Wave abnormalities: normal  Conduction Disutrbances:none  Narrative Interpretation:   Old EKG Reviewed: none available I have personally reviewed the EKG tracing and agree with the computerized printout as noted.   Results for orders placed or performed during the hospital encounter of 11/18/16  Comprehensive  metabolic panel  Result Value Ref Range   Sodium 137 135 - 145 mmol/L   Potassium 4.1 3.5 - 5.1 mmol/L   Chloride 103 101 - 111 mmol/L   CO2 23 22 - 32 mmol/L   Glucose, Bld 145 (H) 65 - 99 mg/dL   BUN 21 (H) 6 - 20 mg/dL   Creatinine, Ser 1.47 (H) 0.44 - 1.00 mg/dL   Calcium 8.8 (L) 8.9 - 10.3 mg/dL   Total Protein 7.3 6.5 - 8.1 g/dL   Albumin 3.5 3.5 - 5.0 g/dL   AST 26 15 - 41 U/L   ALT 24 14 - 54 U/L   Alkaline Phosphatase 89 38 - 126 U/L   Total Bilirubin 2.2 (H) 0.3 - 1.2 mg/dL   GFR calc non Af Amer 41 (L) >60  mL/min   GFR calc Af Amer 47 (L) >60 mL/min   Anion gap 11 5 - 15  Protime-INR  Result Value Ref Range   Prothrombin Time 17.3 (H) 11.4 - 15.2 seconds   INR 1.42   Lipase, blood  Result Value Ref Range   Lipase 15 11 - 51 U/L  I-Stat CG4 Lactic Acid, ED  Result Value Ref Range   Lactic Acid, Venous 2.41 (HH) 0.5 - 1.9 mmol/L   Comment NOTIFIED PHYSICIAN   Type and screen [place]  Result Value Ref Range   ABO/RH(D) O POS    Antibody Screen PENDING    Sample Expiration 11/21/2016      Dg Chest Port 1 View Result Date: 11/18/2016 CLINICAL DATA:  Nausea, vomiting, diarrhea. Leukemia with active chemotherapy. EXAM: PORTABLE CHEST 1 VIEW COMPARISON:  None. FINDINGS: Tip of the right chest port in the distal SVC. Lung volumes are low. Borderline cardiomegaly likely accentuated by technique. No consolidation, pulmonary edema or large pleural effusion, left hemidiaphragm obscured by overlying monitoring devices. No pneumothorax. Scoliotic curvature of the spine. IMPRESSION: Low lung volumes with borderline cardiomegaly. Electronically Signed   By: Jeb Levering M.D.   On: 11/18/2016 23:25    0005:  Pt clinically appears dehydrated. Code sepsis called on pt's arrival to exam room. IVF 30mg /kg bolus ordered. IV abx to be given after [facility] and UC obtained.  Labs and UA pending. Pt will need admission. Sign out to Dr. Lita Mains.      Final Clinical Impressions(s) / ED Diagnoses   Final diagnoses:  None    New Prescriptions New Prescriptions   No medications on file      Francine Graven, DO 11/19/16 0016

## 2016-11-18 NOTE — ED Triage Notes (Signed)
Pt came in from home with c/o vomiting and diarrhea  Pt states it started last night  Pt has leukemia and her last chemo treatment was yesterday  Pt denies pain   Pt states she is weak all over

## 2016-11-18 NOTE — Progress Notes (Signed)
A consult was received from an ED physician for Vancomycin and Zosyn per pharmacy dosing.  The patient's profile has been reviewed for ht/wt/allergies/indication/available labs.   A one time order has been placed for Vancomycin 1gm iv x1 and Zosyn 3.375gm iv x1.  Further antibiotics/pharmacy consults should be ordered by admitting physician if indicated.                       Thank you,  Cyndia Diver PharmD, BCPS  11/18/2016, 10:47 PM

## 2016-11-18 NOTE — ED Notes (Signed)
Notified EDP,Mcmanus,MD., pt. I-stat CG4 Lactic acid results 2.41 and RN,John made aware.

## 2016-11-19 ENCOUNTER — Other Ambulatory Visit: Payer: Self-pay

## 2016-11-19 ENCOUNTER — Encounter (HOSPITAL_COMMUNITY): Payer: Self-pay | Admitting: Internal Medicine

## 2016-11-19 DIAGNOSIS — D61810 Antineoplastic chemotherapy induced pancytopenia: Secondary | ICD-10-CM | POA: Diagnosis present

## 2016-11-19 DIAGNOSIS — J189 Pneumonia, unspecified organism: Secondary | ICD-10-CM | POA: Diagnosis not present

## 2016-11-19 DIAGNOSIS — Z79899 Other long term (current) drug therapy: Secondary | ICD-10-CM | POA: Diagnosis not present

## 2016-11-19 DIAGNOSIS — K529 Noninfective gastroenteritis and colitis, unspecified: Secondary | ICD-10-CM | POA: Diagnosis present

## 2016-11-19 DIAGNOSIS — N179 Acute kidney failure, unspecified: Secondary | ICD-10-CM | POA: Diagnosis present

## 2016-11-19 DIAGNOSIS — C9200 Acute myeloblastic leukemia, not having achieved remission: Secondary | ICD-10-CM | POA: Diagnosis not present

## 2016-11-19 DIAGNOSIS — I959 Hypotension, unspecified: Secondary | ICD-10-CM | POA: Diagnosis not present

## 2016-11-19 DIAGNOSIS — E860 Dehydration: Secondary | ICD-10-CM | POA: Diagnosis present

## 2016-11-19 DIAGNOSIS — A419 Sepsis, unspecified organism: Secondary | ICD-10-CM | POA: Diagnosis present

## 2016-11-19 DIAGNOSIS — R112 Nausea with vomiting, unspecified: Secondary | ICD-10-CM | POA: Diagnosis not present

## 2016-11-19 DIAGNOSIS — R197 Diarrhea, unspecified: Secondary | ICD-10-CM | POA: Diagnosis not present

## 2016-11-19 DIAGNOSIS — R509 Fever, unspecified: Secondary | ICD-10-CM | POA: Diagnosis not present

## 2016-11-19 DIAGNOSIS — C9201 Acute myeloblastic leukemia, in remission: Secondary | ICD-10-CM | POA: Diagnosis present

## 2016-11-19 DIAGNOSIS — T451X5A Adverse effect of antineoplastic and immunosuppressive drugs, initial encounter: Secondary | ICD-10-CM | POA: Diagnosis present

## 2016-11-19 DIAGNOSIS — D61818 Other pancytopenia: Secondary | ICD-10-CM | POA: Diagnosis not present

## 2016-11-19 DIAGNOSIS — G43909 Migraine, unspecified, not intractable, without status migrainosus: Secondary | ICD-10-CM | POA: Diagnosis present

## 2016-11-19 LAB — URINALYSIS, ROUTINE W REFLEX MICROSCOPIC
Bilirubin Urine: NEGATIVE
GLUCOSE, UA: NEGATIVE mg/dL
Ketones, ur: NEGATIVE mg/dL
Leukocytes, UA: NEGATIVE
NITRITE: NEGATIVE
PROTEIN: NEGATIVE mg/dL
Specific Gravity, Urine: 1.006 (ref 1.005–1.030)
pH: 5 (ref 5.0–8.0)

## 2016-11-19 LAB — CBC
HEMATOCRIT: 18.6 % — AB (ref 36.0–46.0)
HEMOGLOBIN: 6.6 g/dL — AB (ref 12.0–15.0)
MCH: 29.1 pg (ref 26.0–34.0)
MCHC: 35.5 g/dL (ref 30.0–36.0)
MCV: 81.9 fL (ref 78.0–100.0)
Platelets: <5 10*3/uL — CL (ref 150–400)
RBC: 2.27 MIL/uL — ABNORMAL LOW (ref 3.87–5.11)
RDW: 13.2 % (ref 11.5–15.5)
WBC: 0.6 10*3/uL — CL (ref 4.0–10.5)

## 2016-11-19 LAB — TYPE AND SCREEN
ABO/RH(D): O POS
Antibody Screen: NEGATIVE

## 2016-11-19 LAB — CREATININE, SERUM: CREATININE: 1.03 mg/dL — AB (ref 0.44–1.00)

## 2016-11-19 LAB — TSH: TSH: 0.284 u[IU]/mL — AB (ref 0.350–4.500)

## 2016-11-19 LAB — I-STAT CG4 LACTIC ACID, ED: LACTIC ACID, VENOUS: 1.35 mmol/L (ref 0.5–1.9)

## 2016-11-19 MED ORDER — NYSTATIN 100000 UNIT/ML MT SUSP
5.0000 mL | Freq: Four times a day (QID) | OROMUCOSAL | Status: DC
  Administered 2016-11-23: 500000 [IU] via ORAL
  Filled 2016-11-19 (×12): qty 5

## 2016-11-19 MED ORDER — ONDANSETRON HCL 4 MG PO TABS: 4.0000 mg | ORAL_TABLET | Freq: Four times a day (QID) | ORAL | Status: DC | PRN

## 2016-11-19 MED ORDER — PROMETHAZINE HCL 25 MG PO TABS
50.0000 mg | ORAL_TABLET | Freq: Every day | ORAL | Status: DC
  Administered 2016-11-19 – 2016-11-23 (×4): 50 mg via ORAL
  Filled 2016-11-19 (×5): qty 2

## 2016-11-19 MED ORDER — TRAZODONE HCL 50 MG PO TABS: 25.0000 mg | ORAL_TABLET | Freq: Every evening | ORAL | Status: DC | PRN

## 2016-11-19 MED ORDER — ONDANSETRON HCL 4 MG/2ML IJ SOLN
4.0000 mg | Freq: Once | INTRAMUSCULAR | Status: AC
  Administered 2016-11-19: 4 mg via INTRAVENOUS
  Filled 2016-11-19: qty 2

## 2016-11-19 MED ORDER — ENOXAPARIN SODIUM 40 MG/0.4ML ~~LOC~~ SOLN
40.0000 mg | SUBCUTANEOUS | Status: DC
  Administered 2016-11-19: 40 mg via SUBCUTANEOUS
  Filled 2016-11-19 (×2): qty 0.4

## 2016-11-19 MED ORDER — HYDROCODONE-ACETAMINOPHEN 5-325 MG PO TABS: 1.0000 | ORAL_TABLET | ORAL | Status: DC | PRN

## 2016-11-19 MED ORDER — ACYCLOVIR 400 MG PO TABS
400.0000 mg | ORAL_TABLET | Freq: Every day | ORAL | Status: DC
  Administered 2016-11-20 – 2016-11-23 (×4): 400 mg via ORAL
  Filled 2016-11-19 (×6): qty 1

## 2016-11-19 MED ORDER — SODIUM CHLORIDE 0.9% FLUSH
10.0000 mL | INTRAVENOUS | Status: DC | PRN
  Administered 2016-11-21: 20 mL
  Administered 2016-11-22: 10 mL
  Administered 2016-11-23: 20 mL
  Administered 2016-11-23: 10 mL
  Filled 2016-11-19 (×4): qty 40

## 2016-11-19 MED ORDER — PIPERACILLIN-TAZOBACTAM 3.375 G IVPB
3.3750 g | Freq: Three times a day (TID) | INTRAVENOUS | Status: DC
  Administered 2016-11-19 – 2016-11-23 (×12): 3.375 g via INTRAVENOUS
  Filled 2016-11-19 (×14): qty 50

## 2016-11-19 MED ORDER — POLYVINYL ALCOHOL 1.4 % OP SOLN
2.0000 [drp] | OPHTHALMIC | Status: DC
  Administered 2016-11-20 – 2016-11-22 (×18): 2 [drp] via OPHTHALMIC
  Filled 2016-11-19 (×2): qty 15

## 2016-11-19 MED ORDER — VANCOMYCIN HCL IN DEXTROSE 750-5 MG/150ML-% IV SOLN
750.0000 mg | INTRAVENOUS | Status: DC
  Administered 2016-11-19: 750 mg via INTRAVENOUS
  Filled 2016-11-19 (×2): qty 150

## 2016-11-19 MED ORDER — PREDNISOLONE ACETATE 1 % OP SUSP
2.0000 [drp] | OPHTHALMIC | Status: DC
  Administered 2016-11-20 – 2016-11-22 (×18): 2 [drp] via OPHTHALMIC
  Filled 2016-11-19 (×2): qty 5

## 2016-11-19 MED ORDER — ONDANSETRON HCL 4 MG/2ML IJ SOLN
4.0000 mg | Freq: Four times a day (QID) | INTRAMUSCULAR | Status: DC | PRN
  Administered 2016-11-22 – 2016-11-23 (×2): 4 mg via INTRAVENOUS
  Filled 2016-11-19 (×2): qty 2

## 2016-11-19 NOTE — ED Notes (Addendum)
1 am I stat order was clicked off late

## 2016-11-19 NOTE — H&P (Addendum)
History and Physical    Morgan Waters:[id number] DOB: Apr 19, 1965 DOA: 11/18/2016  Referring MD/NP/PA: EDP PCP: Clent Demark, PA-C  Outpatient Specialists: hematology/oncology, Dr. Burr Medico Patient coming from: home  Chief Complaint: nausea vomiting diarrhea  HPI: Morgan Waters is a 51 y.o. female with medical history significant for but not limited to leukemia currently undergoing chemotherapy presenting with 2 day history of nausea, vomiting with watery non-bloody and non-mucoid diarrhea stools without fever or chills, abdominal pain, chest pain, shortness of breath, melena or bright red blood per rectum.  ED Course: at the ED patient was hypotensive with systolic BP of 82 mmHg, tachycardic at 102with elevated lactic acid level and pancytopenia. Chest x-ray was negative for airspace disease from yesterday 11/18/2016. 12-lead EKG was negative for any acute ST-T wave changes. CODE STATUS was called and patient was given IV fluid bolus with antibiotics and is admitted for further management Review of Systems: As per HPI otherwise 10 point review of systems negative.  Past Medical History:  Diagnosis Date  . Leukemia ([place])   . Migraine     Past Surgical History:  Procedure Laterality Date  . ABDOMINAL HYSTERECTOMY    . CESAREAN SECTION       reports that she has never smoked. She has never used smokeless tobacco. She reports that she does not drink alcohol or use drugs.  No Known Allergies  Family History  Problem Relation Age of Onset  . Stroke Father   . Anemia Daughter      Prior to Admission medications   Medication Sig Start Date End Date Taking? Authorizing Provider  acyclovir (ZOVIRAX) 400 MG tablet Take 400 mg by mouth daily. 07/29/16  Yes [provider]  ibuprofen (ADVIL,MOTRIN) 200 MG tablet Take 200 mg by mouth every 6 (six) hours as needed for headache, mild pain or moderate pain.    Yes [provider]  levofloxacin (LEVAQUIN)  500 MG tablet Take 500 mg by mouth daily.  07/29/16  Yes [provider]  polyvinyl alcohol (LIQUIFILM TEARS) 1.4 % ophthalmic solution Place 2 drops into both eyes every 4 (four) hours. 07/29/16  Yes [provider]  prednisoLONE acetate (PRED FORTE) 1 % ophthalmic suspension Place 2 drops into both eyes every 4 (four) hours. 07/29/16  Yes [provider]  promethazine (PHENERGAN) 25 MG tablet Take 50 mg by mouth daily. 06/29/16  Yes [provider]  nystatin (MYCOSTATIN) 100000 UNIT/ML suspension Take 5 mLs (500,000 Units total) by mouth 4 (four) times daily. Patient not taking: Reported on 11/19/2016 08/04/16   Truitt Merle, MD    Physical Exam: Vitals:   11/19/16 0001 11/19/16 0013 11/19/16 0233 11/19/16 0542  BP:  107/64 112/80 (!) 141/107  Pulse:  89 95 96  Resp:  12 15 (!) 23  Temp:  98 F (36.7 C) 98.5 F (36.9 C) 98.6 F (37 C)  TempSrc:  Oral Oral Oral  SpO2: 100% 100% 100% 96%      Constitutional: NAD, calm, comfortable Vitals:   11/19/16 0001 11/19/16 0013 11/19/16 0233 11/19/16 0542  BP:  107/64 112/80 (!) 141/107  Pulse:  89 95 96  Resp:  12 15 (!) 23  Temp:  98 F (36.7 C) 98.5 F (36.9 C) 98.6 F (37 C)  TempSrc:  Oral Oral Oral  SpO2: 100% 100% 100% 96%   Eyes: PERRL, lids and conjunctivae normal: Scleral pallor ENMT: Mucous membranes are dry. Posterior pharynx clear of any exudate or lesions.Normal dentition.  Neck: normal, supple, no masses, no thyromegaly Respiratory: clear to auscultation bilaterally, no wheezing, no crackles. Normal respiratory effort. No accessory muscle use.  Cardiovascular: mildly tachycardicregular  rhythm, no murmurs / rubs / gallops. No extremity edema. 2+ pedal pulses. No carotid bruits.  Abdomen: no tenderness, no masses palpated. No hepatosplenomegaly. Bowel sounds positive.  Musculoskeletal: no clubbing / cyanosis. No joint deformity upper and lower extremities. Good ROM, no contractures. Normal muscle  tone.  Skin: no rashes, lesions, ulcers. No induration Neurologic: CN 2-12 grossly intact. Sensation intact, DTR normal. Strength 5/5 in all 4.  Psychiatric:  flat mood.     Labs on Admission: I have personally reviewed following labs and imaging studies  CBC:  Recent Labs Lab 11/16/16 1552  WBC 0.3*  NEUTROABS 0.0*  HGB 9.7*  HCT 28.1*  MCV 87.8  PLT 9*   Basic Metabolic Panel:  Recent Labs Lab 11/16/16 1552 11/18/16 2302  NA 143 137  K 4.2 4.1  CL  --  103  CO2 30* 23  GLUCOSE 100 145*  BUN 16.9 21*  CREATININE 0.9 1.47*  CALCIUM 9.9 8.8*  MG 1.9  --    GFR: Estimated Creatinine Clearance: 35.1 mL/min (A) (by C-G formula based on SCr of 1.47 mg/dL (H)). Liver Function Tests:  Recent Labs Lab 11/16/16 1552 11/18/16 2302  AST 25 26  ALT 24 24  ALKPHOS 109 89  BILITOT 1.16 2.2*  PROT 7.6 7.3  ALBUMIN 3.9 3.5    Recent Labs Lab 11/18/16 2302  LIPASE 15   No results for input(s): AMMONIA in the last 168 hours. Coagulation Profile:  Recent Labs Lab 11/18/16 2302  INR 1.42   Cardiac Enzymes: No results for input(s): CKTOTAL, CKMB, CKMBINDEX, TROPONINI in the last 168 hours. BNP (last 3 results) No results for input(s): PROBNP in the last 8760 hours. HbA1C: No results for input(s): HGBA1C in the last 72 hours. CBG: No results for input(s): GLUCAP in the last 168 hours. Lipid Profile: No results for input(s): CHOL, HDL, LDLCALC, TRIG, CHOLHDL, LDLDIRECT in the last 72 hours. Thyroid Function Tests: No results for input(s): TSH, T4TOTAL, FREET4, T3FREE, THYROIDAB in the last 72 hours. Anemia Panel: No results for input(s): VITAMINB12, FOLATE, FERRITIN, TIBC, IRON, RETICCTPCT in the last 72 hours. Urine analysis:    Component Value Date/Time   COLORURINE YELLOW 11/19/2016 0200   APPEARANCEUR CLEAR 11/19/2016 0200   LABSPEC 1.006 11/19/2016 0200   PHURINE 5.0 11/19/2016 0200   GLUCOSEU NEGATIVE 11/19/2016 0200   HGBUR SMALL (A) 11/19/2016  0200   BILIRUBINUR NEGATIVE 11/19/2016 0200   KETONESUR NEGATIVE 11/19/2016 0200   PROTEINUR NEGATIVE 11/19/2016 0200   UROBILINOGEN 2.0 (H) 04/20/2008 1409   NITRITE NEGATIVE 11/19/2016 0200   LEUKOCYTESUR NEGATIVE 11/19/2016 0200   Sepsis Labs: @LABRCNTIP (procalcitonin:4,lacticidven:4) )No results found for this or any previous visit (from the past 240 hour(s)).   Radiological Exams on Admission: Dg Chest Port 1 View  Result Date: 11/18/2016 CLINICAL DATA:  Nausea, vomiting, diarrhea. Leukemia with active chemotherapy. EXAM: PORTABLE CHEST 1 VIEW COMPARISON:  None. FINDINGS: Tip of the right chest port in the distal SVC. Lung volumes are low. Borderline cardiomegaly likely accentuated by technique. No consolidation, pulmonary edema or large pleural effusion, left hemidiaphragm obscured by overlying monitoring devices. No pneumothorax. Scoliotic curvature of the spine. IMPRESSION: Low lung volumes with borderline cardiomegaly. Electronically Signed   By: Jeb Levering M.D.   On: 11/18/2016 23:25    EKG: Independently reviewed.   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 Difficile toxin Supportive care  #2 dehydration: Due to diagnosis #1 IV rehydration Doubt sepsis- suspect tachycardia and bilateral borderline hypotension due to volume depletion- ?? hold off antibiotics for now-resume prn  #3 Acute kidney injury: Due to dehydration with volume depletion IV rehydration with monitoring of renal function and electrolytes  #4 history of leukemia with pancytopenia: Suspect due to chemotherapy Follow clinically Consider hematology /oncology consult   DVT prophylaxis; Lovenox  Code status:  (Full) Family Communication:  Disposition Plan: Home Consults called:    Admission status: (inpatient / tele)   OSEI-BONSU,Prescious Hurless MD [facility] Pager [phone]  If 7PM-7AM, please contact night-coverage [email or web address] Password [email or web address]  11/19/2016, 7:40 AM

## 2016-11-19 NOTE — Progress Notes (Signed)
Pharmacy Antibiotic Note  Morgan Waters is a 51 y.o. female admitted on 11/18/2016 with sepsis.  Pharmacy has been consulted for vancomycin and zosyn dosing. Patient receiving HiDAC for AML at [facility] (appears was admitted 9/14 for administration of chemo).  At time of admission, labs reveal AKI and neutropenia.  No fevers noted at admission   Plan:  Vancomycin 750mg  IV q24h  Trough at steady state  Zosyn 3.375gm IV q8h over 4h infusion  Daily SCr for increase risk of nephrotoxicity with above regimen  De-escalate with culture results (C. Diff also pending)     Temp (24hrs), Avg:98.4 F (36.9 C), Min:98 F (36.7 C), Max:98.6 F (37 C)   Recent Labs Lab 11/16/16 1552 11/16/16 1552 11/18/16 2302 11/18/16 2325 11/19/16 0418  WBC 0.3*  --   --   --   --   CREATININE  --  0.9 1.47*  --   --   LATICACIDVEN  --   --   --  2.41* 1.35    Estimated Creatinine Clearance: 35.1 mL/min (A) (by C-G formula based on SCr of 1.47 mg/dL (H)).    No Known Allergies  Antimicrobials this admission: 9/28 vanco >> 9/28 zosyn >>  Dose adjustments this admission:  Microbiology results: 9/28 BCx:  9/29 UCx:   9/29 C. Difficile:   Thank you for allowing pharmacy to be a part of this patient's care.  Doreene Eland, PharmD, BCPS.   Pager: [phone] 11/19/2016 11:10 AM

## 2016-11-19 NOTE — ED Notes (Signed)
Bed: WA29 Expected date:  Expected time:  Means of arrival:  Comments: rm 5

## 2016-11-19 NOTE — Progress Notes (Signed)
CRITICAL VALUE ALERT  Critical Value:  WBC 0.6, Hgb 6.6, Platelet <5  Date & Time Notied:  2100 11/19/16  Provider Notified: Kennon Holter   Orders Received/Actions taken:

## 2016-11-19 NOTE — ED Notes (Signed)
Please call Gretchen-RN [phone] for report at  1720.

## 2016-11-19 NOTE — ED Notes (Signed)
Unable to get 2nd set of cultures  Advised to start antibiotics

## 2016-11-20 DIAGNOSIS — K529 Noninfective gastroenteritis and colitis, unspecified: Secondary | ICD-10-CM

## 2016-11-20 DIAGNOSIS — I959 Hypotension, unspecified: Secondary | ICD-10-CM

## 2016-11-20 LAB — CBC
HCT: 23.3 % — ABNORMAL LOW (ref 36.0–46.0)
Hemoglobin: 8.3 g/dL — ABNORMAL LOW (ref 12.0–15.0)
MCH: 29.6 pg (ref 26.0–34.0)
MCHC: 35.6 g/dL (ref 30.0–36.0)
MCV: 83.2 fL (ref 78.0–100.0)
Platelets: 6 10*3/uL — CL (ref 150–400)
RBC: 2.8 MIL/uL — ABNORMAL LOW (ref 3.87–5.11)
RDW: 13.2 % (ref 11.5–15.5)
WBC: 2.1 10*3/uL — AB (ref 4.0–10.5)

## 2016-11-20 LAB — CREATININE, SERUM: CREATININE: 0.92 mg/dL (ref 0.44–1.00)

## 2016-11-20 LAB — CBC WITH DIFFERENTIAL/PLATELET
BASOS ABS: 0 10*3/uL (ref 0.0–0.1)
BASOS PCT: 0 %
EOS ABS: 0 10*3/uL (ref 0.0–0.7)
EOS PCT: 1 %
HCT: 17.5 % — ABNORMAL LOW (ref 36.0–46.0)
Hemoglobin: 6.3 g/dL — CL (ref 12.0–15.0)
Lymphocytes Relative: 20 %
Lymphs Abs: 0.2 10*3/uL — ABNORMAL LOW (ref 0.7–4.0)
MCH: 30.3 pg (ref 26.0–34.0)
MCHC: 36 g/dL (ref 30.0–36.0)
MCV: 84.1 fL (ref 78.0–100.0)
Monocytes Absolute: 0.3 10*3/uL (ref 0.1–1.0)
Monocytes Relative: 30 %
Neutro Abs: 0.5 10*3/uL — ABNORMAL LOW (ref 1.7–7.7)
Neutrophils Relative %: 49 %
RBC: 2.08 MIL/uL — AB (ref 3.87–5.11)
RDW: 13.4 % (ref 11.5–15.5)
WBC: 1.1 10*3/uL — AB (ref 4.0–10.5)

## 2016-11-20 LAB — HIV ANTIBODY (ROUTINE TESTING W REFLEX): HIV SCREEN 4TH GENERATION: NONREACTIVE

## 2016-11-20 LAB — GLUCOSE, CAPILLARY
Glucose-Capillary: 52 mg/dL — ABNORMAL LOW (ref 65–99)
Glucose-Capillary: 101 mg/dL — ABNORMAL HIGH (ref 65–99)

## 2016-11-20 LAB — PREPARE RBC (CROSSMATCH)

## 2016-11-20 MED ORDER — SODIUM CHLORIDE 0.9 % IV SOLN: Freq: Once | INTRAVENOUS | Status: DC

## 2016-11-20 MED ORDER — DIPHENHYDRAMINE HCL 50 MG/ML IJ SOLN
25.0000 mg | Freq: Once | INTRAMUSCULAR | Status: AC
  Administered 2016-11-20: 25 mg via INTRAVENOUS
  Filled 2016-11-20: qty 1

## 2016-11-20 MED ORDER — SODIUM CHLORIDE 0.9 % IV SOLN
Freq: Once | INTRAVENOUS | Status: AC
  Administered 2016-11-20: 04:00:00 via INTRAVENOUS

## 2016-11-20 MED ORDER — ACETAMINOPHEN 325 MG PO TABS
650.0000 mg | ORAL_TABLET | Freq: Once | ORAL | Status: AC
  Administered 2016-11-20: 650 mg via ORAL
  Filled 2016-11-20: qty 2

## 2016-11-20 MED ORDER — VANCOMYCIN HCL 500 MG IV SOLR
500.0000 mg | Freq: Two times a day (BID) | INTRAVENOUS | Status: DC
  Administered 2016-11-20 – 2016-11-22 (×5): 500 mg via INTRAVENOUS
  Filled 2016-11-20 (×6): qty 500

## 2016-11-20 NOTE — Progress Notes (Signed)
Pharmacy Antibiotic Note  Morgan Waters is a 51 y.o. female admitted on 11/18/2016 with sepsis.  Pharmacy has been consulted for vancomycin and zosyn dosing. Patient receiving HiDAC for AML at [facility] (appears was admitted 9/14 for administration of chemo).  At time of admission, labs reveal AKI and neutropenia.  No fevers noted at admission   Today, 11/20/2016  Day 2 abx  Renal: SCr improved  WBC improved to 1.1 (ANC = 500)  No fevers  Plan:  Change Vancomycin to 500mg  IV q12h for improved renal fx  Trough at steady state  Zosyn 3.375gm IV q8h over 4h infusion  Daily SCr for increase risk of nephrotoxicity with above regimen  De-escalate with culture results (C. Diff also pending - not collected as diarrhea resolved??)  Height: 4\' 11"  (149.9 cm) Weight: 123 lb 10.9 oz (56.1 kg) IBW/kg (Calculated) : 43.2  Temp (24hrs), Avg:99.3 F (37.4 C), Min:99.3 F (37.4 C), Max:99.4 F (37.4 C)   Recent Labs Lab 11/16/16 1552 11/16/16 1552 11/18/16 2302 11/18/16 2325 11/19/16 0418 11/19/16 1955 11/20/16 0318  WBC 0.3*  --   --   --   --  0.6* 1.1*  CREATININE  --  0.9 1.47*  --   --  1.03* 0.92  LATICACIDVEN  --   --   --  2.41* 1.35  --   --     Estimated Creatinine Clearance: 56.5 mL/min (by C-G formula based on SCr of 0.92 mg/dL).    No Known Allergies  Antimicrobials this admission: 9/28 vanco >> 9/28 zosyn >>  Dose adjustments this admission:  Microbiology results: 9/28 BCx:  9/29 UCx:   9/29 C. Difficile: not collected  Thank you for allowing pharmacy to be a part of this patient's care.  Doreene Eland, PharmD, BCPS.   Pager: [phone] 11/20/2016 9:46 AM

## 2016-11-20 NOTE — Progress Notes (Signed)
PROGRESS NOTE    Morgan Waters  PPI:[id number] DOB: 01/11/1966 DOA: 11/18/2016 PCP: Clent Demark, PA-C    Brief Narrative:  51 y.o. female with medical history significant for but not limited to leukemia currently undergoing chemotherapy presenting with 2 day history of nausea, vomiting with watery non-bloody and non-mucoid diarrhea stools without fever or chills, abdominal pain, chest pain, shortness of breath, melena or bright red blood per rectum.  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 [facility] recommendation to give 1 unit platelets for plt<20 and 2 units of PRBC's for hgb<9. Will order 1 unit platelets and 2 units of hgb  DVT prophylaxis: SCD's Code Status: Full Family Communication: Pt in room, family not at bedside Disposition Plan: Uncertain at this time  Consultants:     Procedures:     Antimicrobials: Anti-infectives    Start     Dose/Rate Route Frequency Ordered Stop   11/20/16 1100  vancomycin (VANCOCIN) 500 mg in sodium chloride 0.9 % 100 mL IVPB     500 mg 100 mL/hr over 60 Minutes Intravenous 2 times daily 11/20/16 0943     11/19/16 1900  acyclovir (ZOVIRAX) tablet 400 mg     400 mg Oral Daily 11/19/16 1807     11/19/16 1600  vancomycin (VANCOCIN) IVPB 750 mg/150 ml premix  Status:  Discontinued     750 mg 150 mL/hr over 60 Minutes Intravenous Every 24  hours 11/19/16 1113 11/20/16 0943   11/19/16 1200  piperacillin-tazobactam (ZOSYN) IVPB 3.375 g     3.375 g 12.5 mL/hr over 240 Minutes Intravenous Every 8 hours 11/19/16 1113     11/18/16 2245  piperacillin-tazobactam (ZOSYN) IVPB 3.375 g     3.375 g 100 mL/hr over 30 Minutes Intravenous  Once 11/18/16 2231 11/19/16 0056   11/18/16 2245  vancomycin (VANCOCIN) IVPB 1000 mg/200 mL premix     1,000 mg 200 mL/hr over 60 Minutes Intravenous  Once 11/18/16 2231 11/19/16 0208       Subjective: No further diarrhea noted  Objective: Vitals:   11/20/16 0648 11/20/16 0705 11/20/16 0946 11/20/16 1346  BP: 129/84 122/79 128/84 128/86  Pulse: 82 85 77 81  Resp: 16 16 20 18   Temp: 99.3 F (37.4 C) 99.3 F (37.4 C) 98.2 F (36.8 C) 98.1 F (36.7 C)  TempSrc: Oral Oral Oral Oral  SpO2: 100% 100%  100%  Weight:      Height:        Intake/Output Summary (Last 24 hours) at 11/20/16 1627 Last data filed at 11/20/16 0946  Gross per 24 hour  Intake          2410.33 ml  Output              600 ml  Net          1810.33 ml   Filed Weights   11/19/16 1906 11/20/16 0500  Weight: 55.5 kg (122 lb 5.7  oz) 56.1 kg (123 lb 10.9 oz)    Examination:  General exam: Appears calm and comfortable  Respiratory system: Clear to auscultation. Respiratory effort normal. Cardiovascular system: S1 & S2 heard, RRR. Gastrointestinal system: Abdomen is nondistended, soft and nontender. No organomegaly or masses felt. Normal bowel sounds heard. Central nervous system: Alert and oriented. No focal neurological deficits. Extremities: Symmetric 5 x 5 power. Skin: No rashes, lesions Psychiatry: Judgement and insight appear normal. Mood & affect appropriate.   Data Reviewed: I have personally reviewed following labs and imaging studies  CBC:  Recent Labs Lab 11/16/16 1552 11/19/16 1955 11/20/16 0318 11/20/16 1241  WBC 0.3* 0.6* 1.1* 2.1*  NEUTROABS 0.0*  --  0.5*  --   HGB 9.7* 6.6* 6.3* 8.3*  HCT  28.1* 18.6* 17.5* 23.3*  MCV 87.8 81.9 84.1 83.2  PLT 9* <5* <5* 6*   Basic Metabolic Panel:  Recent Labs Lab 11/16/16 1552 11/18/16 2302 11/19/16 1955 11/20/16 0318  NA 143 137  --   --   K 4.2 4.1  --   --   CL  --  103  --   --   CO2 30* 23  --   --   GLUCOSE 100 145*  --   --   BUN 16.9 21*  --   --   CREATININE 0.9 1.47* 1.03* 0.92  CALCIUM 9.9 8.8*  --   --   MG 1.9  --   --   --    GFR: Estimated Creatinine Clearance: 56.5 mL/min (by C-G formula based on SCr of 0.92 mg/dL). Liver Function Tests:  Recent Labs Lab 11/16/16 1552 11/18/16 2302  AST 25 26  ALT 24 24  ALKPHOS 109 89  BILITOT 1.16 2.2*  PROT 7.6 7.3  ALBUMIN 3.9 3.5    Recent Labs Lab 11/18/16 2302  LIPASE 15   No results for input(s): AMMONIA in the last 168 hours. Coagulation Profile:  Recent Labs Lab 11/18/16 2302  INR 1.42   Cardiac Enzymes: No results for input(s): CKTOTAL, CKMB, CKMBINDEX, TROPONINI in the last 168 hours. BNP (last 3 results) No results for input(s): PROBNP in the last 8760 hours. HbA1C: No results for input(s): HGBA1C in the last 72 hours. CBG:  Recent Labs Lab 11/20/16 0740  GLUCAP 52*   Lipid Profile: No results for input(s): CHOL, HDL, LDLCALC, TRIG, CHOLHDL, LDLDIRECT in the last 72 hours. Thyroid Function Tests:  Recent Labs  11/19/16 1955  TSH 0.284*   Anemia Panel: No results for input(s): VITAMINB12, FOLATE, FERRITIN, TIBC, IRON, RETICCTPCT in the last 72 hours. Sepsis Labs:  Recent Labs Lab 11/18/16 2325 11/19/16 0418  LATICACIDVEN 2.41* 1.35    Recent Results (from the past 240 hour(s))  Culture, blood (Routine x 2)     Status: None (Preliminary result)   Collection Time: 11/18/16 11:02 PM  Result Value Ref Range Status   Specimen Description BLOOD CENTRAL LINE  Final   Special Requests   Final    BOTTLES DRAWN AEROBIC AND ANAEROBIC Blood Culture adequate volume   Culture   Final    NO GROWTH 1 DAY Performed at [facility], [place] [address] [place], [place] [address]    Report Status PENDING  Incomplete  Urine culture     Status: None (Preliminary result)   Collection Time: 11/19/16  2:00 AM  Result Value Ref Range Status   Specimen Description URINE, RANDOM  Final   Special Requests NONE  Final  Culture   Final    CULTURE REINCUBATED FOR BETTER GROWTH Performed at [facility], [place] [address] [place], [place] [address]    Report Status PENDING  Incomplete     Radiology Studies: Dg Chest Port 1 View  Result Date: 11/18/2016 CLINICAL DATA:  Nausea, vomiting, diarrhea. Leukemia with active chemotherapy. EXAM: PORTABLE CHEST 1 VIEW COMPARISON:  None. FINDINGS: Tip of the right chest port in the distal SVC. Lung volumes are low. Borderline cardiomegaly likely accentuated by technique. No consolidation, pulmonary edema or large pleural effusion, left hemidiaphragm obscured by overlying monitoring devices. No pneumothorax. Scoliotic curvature of the spine. IMPRESSION: Low lung volumes with borderline cardiomegaly. Electronically Signed   By: Jeb Levering M.D.   On: 11/18/2016 23:25    Scheduled Meds: . acyclovir  400 mg Oral Daily  . nystatin  5 mL Oral QID  . polyvinyl alcohol  2 drop Both Eyes Q4H  . prednisoLONE acetate  2 drop Both Eyes Q4H  . promethazine  50 mg Oral Daily   Continuous Infusions: . sodium chloride 1,000 mL (11/20/16 1225)  . sodium chloride    . piperacillin-tazobactam (ZOSYN)  IV Stopped (11/20/16 1000)  . vancomycin 500 mg (11/20/16 1154)     LOS: 1 day   CHIU, Orpah Melter, MD [facility] Pager [phone]  If 7PM-7AM, please contact night-coverage [email or web address] Password [email or web address] 11/20/2016, 4:27 PM

## 2016-11-20 NOTE — Progress Notes (Signed)
CRITICAL VALUE ALERT  Critical Value:  WBC 1.1, Hgb 6.3, Platelets <5  Date & Time Notied:  11/20/16 0350  Provider Notified: Kennon Holter   Orders Received/Actions taken: Transfusion orders

## 2016-11-21 ENCOUNTER — Other Ambulatory Visit: Payer: Medicaid Other

## 2016-11-21 ENCOUNTER — Inpatient Hospital Stay (HOSPITAL_COMMUNITY): Payer: Medicaid Other

## 2016-11-21 DIAGNOSIS — R112 Nausea with vomiting, unspecified: Secondary | ICD-10-CM

## 2016-11-21 DIAGNOSIS — R509 Fever, unspecified: Secondary | ICD-10-CM

## 2016-11-21 DIAGNOSIS — R197 Diarrhea, unspecified: Secondary | ICD-10-CM

## 2016-11-21 DIAGNOSIS — N179 Acute kidney failure, unspecified: Secondary | ICD-10-CM

## 2016-11-21 DIAGNOSIS — E860 Dehydration: Secondary | ICD-10-CM

## 2016-11-21 DIAGNOSIS — C9201 Acute myeloblastic leukemia, in remission: Secondary | ICD-10-CM

## 2016-11-21 DIAGNOSIS — D61818 Other pancytopenia: Secondary | ICD-10-CM

## 2016-11-21 LAB — CBC
HCT: 29.6 % — ABNORMAL LOW (ref 36.0–46.0)
Hemoglobin: 10.5 g/dL — ABNORMAL LOW (ref 12.0–15.0)
MCH: 29.6 pg (ref 26.0–34.0)
MCHC: 35.5 g/dL (ref 30.0–36.0)
MCV: 83.4 fL (ref 78.0–100.0)
PLATELETS: 9 10*3/uL — AB (ref 150–400)
RBC: 3.55 MIL/uL — AB (ref 3.87–5.11)
RDW: 13.4 % (ref 11.5–15.5)
WBC: 6.1 10*3/uL (ref 4.0–10.5)

## 2016-11-21 LAB — BASIC METABOLIC PANEL
Anion gap: 9 (ref 5–15)
BUN: 14 mg/dL (ref 6–20)
CO2: 22 mmol/L (ref 22–32)
CREATININE: 0.83 mg/dL (ref 0.44–1.00)
Calcium: 8.3 mg/dL — ABNORMAL LOW (ref 8.9–10.3)
Chloride: 118 mmol/L — ABNORMAL HIGH (ref 101–111)
GFR calc non Af Amer: >60 mL/min (ref >60)
Glucose, Bld: 68 mg/dL (ref 65–99)
POTASSIUM: 3.5 mmol/L (ref 3.5–5.1)
SODIUM: 149 mmol/L — AB (ref 135–145)

## 2016-11-21 MED ORDER — ACETAMINOPHEN 325 MG PO TABS
325.0000 mg | ORAL_TABLET | Freq: Four times a day (QID) | ORAL | Status: DC | PRN
  Administered 2016-11-21 (×2): 325 mg via ORAL
  Filled 2016-11-21 (×2): qty 1

## 2016-11-21 MED ORDER — IBUPROFEN 200 MG PO TABS
400.0000 mg | ORAL_TABLET | ORAL | Status: DC | PRN
  Administered 2016-11-21: 400 mg via ORAL
  Filled 2016-11-21: qty 2

## 2016-11-21 MED ORDER — SODIUM CHLORIDE 0.9 % IV SOLN
Freq: Once | INTRAVENOUS | Status: AC
  Administered 2016-11-21: 14:00:00 via INTRAVENOUS

## 2016-11-21 NOTE — Progress Notes (Signed)
Hypoglycemic Event  CBG: 52  Treatment: 15 GM carbohydrate snack  Symptoms: None  Follow-up CBG: Time:0911 CBG Result:101  Possible Reasons for Event: Inadequate meal intake  Comments/MD notified:hypoglycemic protocol and Dr Wyline Copas notified    Illa Level

## 2016-11-21 NOTE — Progress Notes (Signed)
Morgan Waters   DOB:[birth year]   YH#:[id number]   TDV#:[id number]  Hematology and oncology follow up note   Subjective: Pt was admitted on 11/19/16 for nausea, vomiting and diarrhea. I last saw her in my office on 9/27. 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 She feels little better today, but developed fever this afternoon.    Objective:  Vitals:   11/21/16 2006 11/21/16 2152  BP: (!) 153/89 139/70  Pulse: 76 76  Resp: (!) 28 (!) 22  Temp: 100 F (37.8 C) 99.7 F (37.6 C)  SpO2: 94% 98%    Body mass index is 25.87 kg/m.  Intake/Output Summary (Last 24 hours) at 11/21/16 2224 Last data filed at 11/21/16 1402  Gross per 24 hour  Intake          1477.33 ml  Output              150 ml  Net          1327.33 ml     Sclerae unicteric  Oropharynx clear  No peripheral adenopathy  Lungs clear -- no rales or rhonchi  Heart regular rate and rhythm  Abdomen benign  MSK no focal spinal tenderness, no peripheral edema  Neuro nonfocal    CBG (last 3)   Recent Labs  11/20/16 0740 11/20/16 0911  GLUCAP 52* 101*     Labs:  Lab Results  Component Value Date   WBC 6.1 11/21/2016   HGB 10.5 (L) 11/21/2016   HCT 29.6 (L) 11/21/2016   MCV 83.4 11/21/2016   PLT 9 (LL) 11/21/2016   NEUTROABS 0.5 (L) 11/20/2016   CMP Latest Ref Rng & Units 11/21/2016 11/20/2016 11/19/2016  Glucose 65 - 99 mg/dL 68 - -  BUN 6 - 20 mg/dL 14 - -  Creatinine 0.44 - 1.00 mg/dL 0.83 0.92 1.03(H)  Sodium 135 - 145 mmol/L 149(H) - -  Potassium 3.5 - 5.1 mmol/L 3.5 - -  Chloride 101 - 111 mmol/L 118(H) - -  CO2 22 - 32 mmol/L 22 - -  Calcium 8.9 - 10.3 mg/dL 8.3(L) - -  Total Protein 6.5 - 8.1 g/dL - - -  Total Bilirubin 0.3 - 1.2 mg/dL - - -  Alkaline Phos 38 - 126 U/L - - -  AST 15 - 41 U/L - - -  ALT 14 - 54 U/L - - -     Urine Studies No results for input(s): UHGB, CRYS in the last 72 hours.  Invalid input(s): UACOL, UAPR, USPG, UPH, UTP, UGL,  UKET, UBIL, UNIT, UROB, ULEU, UEPI, UWBC, URBC, UBAC, CAST, [place], [place]  Basic Metabolic Panel:  Recent Labs Lab 11/16/16 1552 11/18/16 2302 11/19/16 1955 11/20/16 0318 11/21/16 0500  NA 143 137  --   --  149*  K 4.2 4.1  --   --  3.5  CL  --  103  --   --  118*  CO2 30* 23  --   --  22  GLUCOSE 100 145*  --   --  68  BUN 16.9 21*  --   --  14  CREATININE 0.9 1.47* 1.03* 0.92 0.83  CALCIUM 9.9 8.8*  --   --  8.3*  MG 1.9  --   --   --   --    GFR Estimated Creatinine Clearance: 63.7 mL/min (by C-G formula based on SCr of 0.83 mg/dL). Liver Function Tests:  Recent Labs Lab 11/16/16  1552 11/18/16 2302  AST 25 26  ALT 24 24  ALKPHOS 109 89  BILITOT 1.16 2.2*  PROT 7.6 7.3  ALBUMIN 3.9 3.5    Recent Labs Lab 11/18/16 2302  LIPASE 15   No results for input(s): AMMONIA in the last 168 hours. Coagulation profile  Recent Labs Lab 11/18/16 2302  INR 1.42    CBC:  Recent Labs Lab 11/16/16 1552 11/19/16 1955 11/20/16 0318 11/20/16 1241 11/21/16 0500  WBC 0.3* 0.6* 1.1* 2.1* 6.1  NEUTROABS 0.0*  --  0.5*  --   --   HGB 9.7* 6.6* 6.3* 8.3* 10.5*  HCT 28.1* 18.6* 17.5* 23.3* 29.6*  MCV 87.8 81.9 84.1 83.2 83.4  PLT 9* <5* <5* 6* 9*   Cardiac Enzymes: No results for input(s): CKTOTAL, CKMB, CKMBINDEX, TROPONINI in the last 168 hours. BNP: Invalid input(s): POCBNP CBG:  Recent Labs Lab 11/20/16 0740 11/20/16 0911  GLUCAP 52* 101*   D-Dimer No results for input(s): DDIMER in the last 72 hours. Hgb A1c No results for input(s): HGBA1C in the last 72 hours. Lipid Profile No results for input(s): CHOL, HDL, LDLCALC, TRIG, CHOLHDL, LDLDIRECT in the last 72 hours. Thyroid function studies  Recent Labs  11/19/16 1955  TSH 0.284*   Anemia work up No results for input(s): VITAMINB12, FOLATE, FERRITIN, TIBC, IRON, RETICCTPCT in the last 72 hours. Microbiology Recent Results (from the past 240 hour(s))  Culture, blood (Routine x 2)     Status: None  (Preliminary result)   Collection Time: 11/18/16 11:02 PM  Result Value Ref Range Status   Specimen Description BLOOD CENTRAL LINE  Final   Special Requests   Final    BOTTLES DRAWN AEROBIC AND ANAEROBIC Blood Culture adequate volume   Culture   Final    NO GROWTH 2 DAYS Performed at [facility], [address] [place], [place] [address]    Report Status PENDING  Incomplete  Urine culture     Status: Abnormal (Preliminary result)   Collection Time: 11/19/16  2:00 AM  Result Value Ref Range Status   Specimen Description URINE, RANDOM  Final   Special Requests NONE  Final   Culture (A)  Final    10,000 COLONIES/mL ENTEROCOCCUS FAECALIS SUSCEPTIBILITIES TO FOLLOW Performed at [facility], [place] [address] [place], [place] [address]    Report Status PENDING  Incomplete      Studies:  Dg Chest Port 1 View  Result Date: 11/21/2016 CLINICAL DATA:  51 year old female with 2 days of nausea vomiting and diarrhea. Leukemia undergoing chemotherapy. EXAM: PORTABLE CHEST 1 VIEW COMPARISON:  11/18/2016. FINDINGS: Portable AP semi upright view at 1624 hours. Stable right chest dual lumen power port. New confluent left lung base opacity obscuring the left hemidiaphragm. Associated left infrahilar air bronchograms. Stable cardiac size and mediastinal contours. Increased pulmonary vascularity. No pneumothorax. Moderate thoracolumbar scoliosis. IMPRESSION: 1. New left lung base consolidation, nonspecific but such as due to pneumonia. Possible small superimposed left pleural effusion. 2. Increased pulmonary vascularity without overt edema. Electronically Signed   By: Genevie Ann M.D.   On: 11/21/2016 17:19    Assessment: 51 y.o. with AML, on consolidation chemo, last cycle on 11/04/16  1. Nausea, vomiting and diarrhea  2. AKI and dehydration  3. Pancytopenia, received neulasta on 11/17/16 4. AML, current in remission, bone marrow transplant recommended  5. fever    Plan:  -WBC normal  today, neutropenia possible resolved now  -she received blood transfusion after  admission, anemia improved  -agree with plt transfusion, to keep Plt above 15k in the hospital  -ID workup pending, she is on antibiotics  -check CBC with diff daily  -I will schedule her lab (twice a week) and visit after discharge  -thanks for the excellent care from the hospitalist team    Truitt Merle, MD 11/21/2016

## 2016-11-21 NOTE — Progress Notes (Signed)
RN notified the IV team that the patient's Platelets were finished at 0641.

## 2016-11-21 NOTE — Care Management Note (Signed)
Case Management Note  Patient Details  Name: ANGEE GUPTON MRN: [id number] Date of Birth: 1965/11/05  Subjective/Objective: 51 y/o f admitted w/Sepsis. From home.PT cons-await recc.                   Action/Plan:d/c plan home.   Expected Discharge Date:                  Expected Discharge Plan:  Home/Self Care  In-House Referral:     Discharge planning Services  CM Consult  Post Acute Care Choice:    Choice offered to:     DME Arranged:    DME Agency:     HH Arranged:    HH Agency:     Status of Service:  In process, will continue to follow  If discussed at Long Length of Stay Meetings, dates discussed:    Additional Comments:  Dessa Phi, RN 11/21/2016, 12:45 PM

## 2016-11-21 NOTE — Progress Notes (Signed)
PROGRESS NOTE    Morgan Waters  HYW:[id number] DOB: 12/09/1965 DOA: 11/18/2016 PCP: Clent Demark, PA-C    Brief Narrative:  51 y.o. female with medical history significant for but not limited to leukemia currently undergoing chemotherapy presenting with 2 day history of nausea, vomiting with watery non-bloody and non-mucoid diarrhea stools without fever or chills, abdominal pain, chest pain, shortness of breath, melena or bright red blood per rectum.  Assessment & Plan:   Active Problems:   Other pancytopenia ([place])   AML (acute myeloblastic leukemia) ([place])   Sepsis ([place])   AKI (acute kidney injury) ([place])   Gastroenteritis   Dehydration  #1 Nausea/vomiting/diarrhea Continued on IVF hydration No further episodes since admission On regular diet  #2 dehydration: Due to diagnosis #1 On IVF hydration Appears improved  #3 Acute kidney injury: Due to dehydration with volume depletion Improved with IVF hydration  #4 history of leukemia with pancytopenia: Suspect due to chemotherapy Given 3 units prbcs this admit with hgb corrected from 6.3 to 10.5 Outside records reviewed. [facility] recommendation to give 1 unit platelets for plt<20 and 2 units of PRBC's for hgb<9. Pt given 1 unit plts. Platelets this AM 9. Discussed case with Dr. Burr Medico. Will order another unit of platelets  #5 Fevers -Temp 100F -New finding since admit to floor -Patient noted to have tachypnea -Hold IVF -Ordered CXR -Continue on empiric vanc, zosyn, acyclovir  DVT prophylaxis: SCD's Code Status: Full Family Communication: Pt in room, family not at bedside Disposition Plan: Uncertain at this time  Consultants:     Procedures:     Antimicrobials: Anti-infectives    Start     Dose/Rate Route Frequency Ordered Stop   11/20/16 1100  vancomycin (VANCOCIN) 500 mg in sodium chloride 0.9 % 100 mL IVPB     500 mg 100 mL/hr over 60 Minutes Intravenous 2 times daily 11/20/16 0943     11/19/16  1900  acyclovir (ZOVIRAX) tablet 400 mg     400 mg Oral Daily 11/19/16 1807     11/19/16 1600  vancomycin (VANCOCIN) IVPB 750 mg/150 ml premix  Status:  Discontinued     750 mg 150 mL/hr over 60 Minutes Intravenous Every 24 hours 11/19/16 1113 11/20/16 0943   11/19/16 1200  piperacillin-tazobactam (ZOSYN) IVPB 3.375 g     3.375 g 12.5 mL/hr over 240 Minutes Intravenous Every 8 hours 11/19/16 1113     11/18/16 2245  piperacillin-tazobactam (ZOSYN) IVPB 3.375 g     3.375 g 100 mL/hr over 30 Minutes Intravenous  Once 11/18/16 2231 11/19/16 0056   11/18/16 2245  vancomycin (VANCOCIN) IVPB 1000 mg/200 mL premix     1,000 mg 200 mL/hr over 60 Minutes Intravenous  Once 11/18/16 2231 11/19/16 0208      Subjective: Patient was without complaints this AM  Objective: Vitals:   11/21/16 0535 11/21/16 0641 11/21/16 1413 11/21/16 1537  BP: 139/79 128/64 (!) 152/103 (!) 164/97  Pulse: (!) 57 (!) 53 74 77  Resp: 16 16 (!) 28 (!) 24  Temp: 97.8 F (36.6 C) 99.5 F (37.5 C) 100 F (37.8 C) 99.6 F (37.6 C)  TempSrc: Oral Oral Oral Oral  SpO2:   99% (!) 88%  Weight:      Height:        Intake/Output Summary (Last 24 hours) at 11/21/16 1539 Last data filed at 11/21/16 0641  Gross per 24 hour  Intake          2377.33 ml  Output  750 ml  Net          1627.33 ml   Filed Weights   11/19/16 1906 11/20/16 0500 11/21/16 0500  Weight: 55.5 kg (122 lb 5.7 oz) 56.1 kg (123 lb 10.9 oz) 58.1 kg (128 lb 1.4 oz)    Examination: General exam: Awake, laying in bed, in nad Respiratory system: Normal respiratory effort, no wheezing Cardiovascular system: regular rate, s1, s2 Gastrointestinal system: Soft, nondistended, positive BS Central nervous system: CN2-12 grossly intact, strength intact Extremities: Perfused, no clubbing Skin: Normal skin turgor, no notable skin lesions seen Psychiatry: Mood normal // no visual hallucinations   Data Reviewed: I have personally reviewed  following labs and imaging studies  CBC:  Recent Labs Lab 11/16/16 1552 11/19/16 1955 11/20/16 0318 11/20/16 1241 11/21/16 0500  WBC 0.3* 0.6* 1.1* 2.1* 6.1  NEUTROABS 0.0*  --  0.5*  --   --   HGB 9.7* 6.6* 6.3* 8.3* 10.5*  HCT 28.1* 18.6* 17.5* 23.3* 29.6*  MCV 87.8 81.9 84.1 83.2 83.4  PLT 9* <5* <5* 6* 9*   Basic Metabolic Panel:  Recent Labs Lab 11/16/16 1552 11/18/16 2302 11/19/16 1955 11/20/16 0318 11/21/16 0500  NA 143 137  --   --  149*  K 4.2 4.1  --   --  3.5  CL  --  103  --   --  118*  CO2 30* 23  --   --  22  GLUCOSE 100 145*  --   --  68  BUN 16.9 21*  --   --  14  CREATININE 0.9 1.47* 1.03* 0.92 0.83  CALCIUM 9.9 8.8*  --   --  8.3*  MG 1.9  --   --   --   --    GFR: Estimated Creatinine Clearance: 63.7 mL/min (by C-G formula based on SCr of 0.83 mg/dL). Liver Function Tests:  Recent Labs Lab 11/16/16 1552 11/18/16 2302  AST 25 26  ALT 24 24  ALKPHOS 109 89  BILITOT 1.16 2.2*  PROT 7.6 7.3  ALBUMIN 3.9 3.5    Recent Labs Lab 11/18/16 2302  LIPASE 15   No results for input(s): AMMONIA in the last 168 hours. Coagulation Profile:  Recent Labs Lab 11/18/16 2302  INR 1.42   Cardiac Enzymes: No results for input(s): CKTOTAL, CKMB, CKMBINDEX, TROPONINI in the last 168 hours. BNP (last 3 results) No results for input(s): PROBNP in the last 8760 hours. HbA1C: No results for input(s): HGBA1C in the last 72 hours. CBG:  Recent Labs Lab 11/20/16 0740 11/20/16 0911  GLUCAP 52* 101*   Lipid Profile: No results for input(s): CHOL, HDL, LDLCALC, TRIG, CHOLHDL, LDLDIRECT in the last 72 hours. Thyroid Function Tests:  Recent Labs  11/19/16 1955  TSH 0.284*   Anemia Panel: No results for input(s): VITAMINB12, FOLATE, FERRITIN, TIBC, IRON, RETICCTPCT in the last 72 hours. Sepsis Labs:  Recent Labs Lab 11/18/16 2325 11/19/16 0418  LATICACIDVEN 2.41* 1.35    Recent Results (from the past 240 hour(s))  Culture, blood  (Routine x 2)     Status: None (Preliminary result)   Collection Time: 11/18/16 11:02 PM  Result Value Ref Range Status   Specimen Description BLOOD CENTRAL LINE  Final   Special Requests   Final    BOTTLES DRAWN AEROBIC AND ANAEROBIC Blood Culture adequate volume   Culture   Final    NO GROWTH 2 DAYS Performed at [facility] [address], [place], [place]  [address]    Report Status PENDING  Incomplete  Urine culture     Status: Abnormal (Preliminary result)   Collection Time: 11/19/16  2:00 AM  Result Value Ref Range Status   Specimen Description URINE, RANDOM  Final   Special Requests NONE  Final   Culture (A)  Final    10,000 COLONIES/mL ENTEROCOCCUS FAECALIS SUSCEPTIBILITIES TO FOLLOW Performed at [facility], [place] [address], [place], [place] [address]    Report Status PENDING  Incomplete     Radiology Studies: No results found.  Scheduled Meds: . acyclovir  400 mg Oral Daily  . nystatin  5 mL Oral QID  . polyvinyl alcohol  2 drop Both Eyes Q4H  . prednisoLONE acetate  2 drop Both Eyes Q4H  . promethazine  50 mg Oral Daily   Continuous Infusions: . sodium chloride 1,000 mL (11/21/16 1144)  . sodium chloride    . piperacillin-tazobactam (ZOSYN)  IV 3.375 g (11/21/16 1402)  . vancomycin Stopped (11/21/16 1233)     LOS: 2 days   CHIU, Orpah Melter, MD [facility] Pager [phone]  If 7PM-7AM, please contact night-coverage [email or web address] Password [email or web address] 11/21/2016, 3:39 PM

## 2016-11-22 LAB — BPAM PLATELET PHERESIS
Blood Product Expiration Date: 201810022359
Blood Product Expiration Date: 201810042359
ISSUE DATE / TIME: 201810010502
ISSUE DATE / TIME: 201810012207
Unit Type and Rh: 5100
Unit Type and Rh: 6200

## 2016-11-22 LAB — URINE CULTURE

## 2016-11-22 LAB — PREPARE PLATELET PHERESIS
UNIT DIVISION: 0
Unit division: 0

## 2016-11-22 LAB — MRSA PCR SCREENING: MRSA by PCR: NEGATIVE

## 2016-11-22 LAB — CBC
HCT: 29.6 % — ABNORMAL LOW (ref 36.0–46.0)
HEMOGLOBIN: 10.7 g/dL — AB (ref 12.0–15.0)
MCH: 29.2 pg (ref 26.0–34.0)
MCHC: 36.1 g/dL — ABNORMAL HIGH (ref 30.0–36.0)
MCV: 80.7 fL (ref 78.0–100.0)
Platelets: 21 10*3/uL — CL (ref 150–400)
RBC: 3.67 MIL/uL — AB (ref 3.87–5.11)
RDW: 13.5 % (ref 11.5–15.5)
WBC: 13.1 10*3/uL — ABNORMAL HIGH (ref 4.0–10.5)

## 2016-11-22 LAB — TYPE AND SCREEN
ABO/RH(D): O POS
ANTIBODY SCREEN: NEGATIVE
UNIT DIVISION: 0
Unit division: 0
Unit division: 0

## 2016-11-22 LAB — CREATININE, SERUM
Creatinine, Ser: 0.81 mg/dL (ref 0.44–1.00)
GFR calc Af Amer: >60 mL/min (ref >60)
GFR calc non Af Amer: >60 mL/min (ref >60)

## 2016-11-22 LAB — BPAM RBC
BLOOD PRODUCT EXPIRATION DATE: 201810192359
Blood Product Expiration Date: 201810192359
Blood Product Expiration Date: 201810192359
ISSUE DATE / TIME: 201809300640
ISSUE DATE / TIME: 201809302111
ISSUE DATE / TIME: 201810010120
UNIT TYPE AND RH: 5100
UNIT TYPE AND RH: 5100
Unit Type and Rh: 5100

## 2016-11-22 LAB — GLUCOSE, CAPILLARY
GLUCOSE-CAPILLARY: 75 mg/dL (ref 65–99)
GLUCOSE-CAPILLARY: 89 mg/dL (ref 65–99)
Glucose-Capillary: 60 mg/dL — ABNORMAL LOW (ref 65–99)

## 2016-11-22 NOTE — Evaluation (Signed)
Physical Therapy Evaluation Patient Details Name: Morgan Waters MRN: [id number] DOB: 03-07-1965 Today's Date: 11/22/2016   History of Present Illness  51 y.o. female with medical history significant for but not limited to leukemia currently undergoing chemotherapy presenting with 2 day history of nausea, vomiting with watery non-bloody and non-mucoid diarrhea stools   Clinical Impression  Patient presents with decreased mobility due to generalized weakness and imbalance and will benefit from skilled PT in the acute setting to allow d/c home with family assist.  Likely not to need initial follow up PT, but recommend RW and 3:1 for home.     Follow Up Recommendations No PT follow up    Equipment Recommendations  Rolling walker with 5" wheels;3in1 (PT)    Recommendations for Other Services       Precautions / Restrictions Precautions Precautions: Fall      Mobility  Bed Mobility               General bed mobility comments: up in recliner  Transfers Overall transfer level: Needs assistance Equipment used: None Transfers: Sit to/from Stand Sit to Stand: Supervision;Min guard         General transfer comment: unaided initially, then unsteady on her feet so provided minguard for balance  Ambulation/Gait Ambulation/Gait assistance: Supervision;Min guard Ambulation Distance (Feet): 200 Feet Assistive device: Rolling walker (2 wheeled) Gait Pattern/deviations: Step-through pattern;Decreased stride length     General Gait Details: slow pace, increased time for turns, steadier with walker  Stairs            Wheelchair Mobility    Modified Rankin (Stroke Patients Only)       Balance Overall balance assessment: Needs assistance   Sitting balance-Leahy Scale: Good       Standing balance-Leahy Scale: Fair Standing balance comment: static standing without UE support, needs RW for ambulation                             Pertinent  Vitals/Pain Pain Assessment: No/denies pain    Home Living Family/patient expects to be discharged to:: Private residence Living Arrangements: Children;Spouse/significant other Available Help at Discharge: Family Type of Home: House Home Access: Level entry     Home Layout: One level Home Equipment: None      Prior Function Level of Independence: Independent               Hand Dominance        Extremity/Trunk Assessment   Upper Extremity Assessment Upper Extremity Assessment: Generalized weakness    Lower Extremity Assessment Lower Extremity Assessment: Generalized weakness       Communication   Communication: No difficulties  Cognition Arousal/Alertness: Awake/alert Behavior During Therapy: Flat affect Overall Cognitive Status: Within Functional Limits for tasks assessed                                        General Comments General comments (skin integrity, edema, etc.): SOB with ambulation, SpO2 90% on RA, HR max 113    Exercises     Assessment/Plan    PT Assessment Patient needs continued PT services  PT Problem List Decreased strength;Decreased mobility;Decreased balance;Decreased knowledge of use of DME;Decreased activity tolerance;Decreased knowledge of precautions       PT Treatment Interventions DME instruction;Gait training;Balance training;Functional mobility training;Therapeutic exercise;Patient/family education;Therapeutic activities    PT Goals (Current  goals can be found in the Care Plan section)  Acute Rehab PT Goals Patient Stated Goal: To go home PT Goal Formulation: With patient Time For Goal Achievement: 11/29/16 Potential to Achieve Goals: Good    Frequency Min 3X/week   Barriers to discharge        Co-evaluation               AM-PAC PT "6 Clicks" Daily Activity  Outcome Measure Difficulty turning over in bed (including adjusting bedclothes, sheets and blankets)?: A Little Difficulty moving from  lying on back to sitting on the side of the bed? : A Little Difficulty sitting down on and standing up from a chair with arms (e.g., wheelchair, bedside commode, etc,.)?: A Little Help needed moving to and from a bed to chair (including a wheelchair)?: A Little Help needed walking in hospital room?: A Little Help needed climbing 3-5 steps with a railing? : A Little 6 Click Score: 18    End of Session Equipment Utilized During Treatment: Gait belt Activity Tolerance: Patient tolerated treatment well Patient left: in chair;with call bell/phone within reach   PT Visit Diagnosis: Muscle weakness (generalized) (M62.81);Other abnormalities of gait and mobility (R26.89)    Time: 2257-5051 PT Time Calculation (min) (ACUTE ONLY): 18 min   Charges:   PT Evaluation $PT Eval Moderate Complexity: 1 Mod     PT G CodesMagda Kiel, [place] [phone] 11/22/2016   Reginia Naas 11/22/2016, 4:36 PM

## 2016-11-22 NOTE — Progress Notes (Signed)
PROGRESS NOTE    Morgan Waters  SWN:[id number] DOB: May 25, 1965 DOA: 11/18/2016 PCP: Clent Demark, PA-C    Brief Narrative:  51 y.o. female with medical history significant for but not limited to leukemia currently undergoing chemotherapy presenting with 2 day history of nausea, vomiting with watery non-bloody and non-mucoid diarrhea stools without fever or chills, abdominal pain, chest pain, shortness of breath, melena or bright red blood per rectum.  Assessment & Plan:   Active Problems:   Other pancytopenia ([place])   AML (acute myeloblastic leukemia) ([place])   Sepsis ([place])   AKI (acute kidney injury) ([place])   Gastroenteritis   Dehydration   Fever  #1 Nausea/vomiting/diarrhea Continued on IVF hydration No further episodes since admission Tolerating regular diet  #2 dehydration: Due to diagnosis #1 Appears improved with IVF hydration  #3 Acute kidney injury: Due to dehydration with volume depletion Improved with IVF hydration Repeat bmet in AM  #4 history of leukemia with pancytopenia: Suspect due to chemotherapy Given 3 units prbcs this admit with hgb corrected from 6.3 to 10.5 Outside records reviewed. [facility] recommendation to give 1 unit platelets for plt<20 and 2 units of PRBC's for hgb<9. Pt given total 2 units platelets with platelets improved from <5 to 21 this AM Repeat cbc in AM  #5 Fevers secondary Pneumonia -Tmax of 100F -New finding since admit to floor -CXR findings suggestive of PNA -Pt had been on empiric vanc, zosyn. MRSA neg, thus will d/c vanc -If continued improvement, would then consider completing course with augmentin on discharge  DVT prophylaxis: SCD's Code Status: Full Family Communication: Pt in room, family not at bedside Disposition Plan: Possible d/c home in 24hrs  Consultants:   Oncology  Procedures:     Antimicrobials: Anti-infectives    Start     Dose/Rate Route Frequency Ordered Stop   11/20/16 1100  vancomycin  (VANCOCIN) 500 mg in sodium chloride 0.9 % 100 mL IVPB  Status:  Discontinued     500 mg 100 mL/hr over 60 Minutes Intravenous 2 times daily 11/20/16 0943 11/22/16 1323   11/19/16 1900  acyclovir (ZOVIRAX) tablet 400 mg     400 mg Oral Daily 11/19/16 1807     11/19/16 1600  vancomycin (VANCOCIN) IVPB 750 mg/150 ml premix  Status:  Discontinued     750 mg 150 mL/hr over 60 Minutes Intravenous Every 24 hours 11/19/16 1113 11/20/16 0943   11/19/16 1200  piperacillin-tazobactam (ZOSYN) IVPB 3.375 g     3.375 g 12.5 mL/hr over 240 Minutes Intravenous Every 8 hours 11/19/16 1113     11/18/16 2245  piperacillin-tazobactam (ZOSYN) IVPB 3.375 g     3.375 g 100 mL/hr over 30 Minutes Intravenous  Once 11/18/16 2231 11/19/16 0056   11/18/16 2245  vancomycin (VANCOCIN) IVPB 1000 mg/200 mL premix     1,000 mg 200 mL/hr over 60 Minutes Intravenous  Once 11/18/16 2231 11/19/16 0208      Subjective: Feels better. Questioning about going home  Objective: Vitals:   11/21/16 2232 11/22/16 0044 11/22/16 0421 11/22/16 0546  BP: (!) 154/92 140/89  (!) 158/97  Pulse: 76 73  71  Resp: (!) 24 (!) 28  (!) 24  Temp: 99.4 F (37.4 C) 98.7 F (37.1 C)  98.9 F (37.2 C)  TempSrc: Oral Oral  Oral  SpO2: 92% 91%  92%  Weight:   60.6 kg (133 lb 9.6 oz)   Height:        Intake/Output Summary (Last 24 hours)  at 11/22/16 1325 Last data filed at 11/22/16 0600  Gross per 24 hour  Intake              652 ml  Output                0 ml  Net              652 ml   Filed Weights   11/20/16 0500 11/21/16 0500 11/22/16 0421  Weight: 56.1 kg (123 lb 10.9 oz) 58.1 kg (128 lb 1.4 oz) 60.6 kg (133 lb 9.6 oz)    Examination: General exam: Conversant, in no acute distress Respiratory system: normal chest rise, clear, no audible wheezing Cardiovascular system: regular rhythm, s1-s2 Gastrointestinal system: Nondistended, nontender, pos BS Central nervous system: No seizures, no tremors Extremities: No cyanosis,  no joint deformities Skin: No rashes, no pallor Psychiatry: Affect normal // no auditory hallucinations   Data Reviewed: I have personally reviewed following labs and imaging studies  CBC:  Recent Labs Lab 11/16/16 1552 11/19/16 1955 11/20/16 0318 11/20/16 1241 11/21/16 0500 11/22/16 0829  WBC 0.3* 0.6* 1.1* 2.1* 6.1 13.1*  NEUTROABS 0.0*  --  0.5*  --   --   --   HGB 9.7* 6.6* 6.3* 8.3* 10.5* 10.7*  HCT 28.1* 18.6* 17.5* 23.3* 29.6* 29.6*  MCV 87.8 81.9 84.1 83.2 83.4 80.7  PLT 9* <5* <5* 6* 9* 21*   Basic Metabolic Panel:  Recent Labs Lab 11/16/16 1552 11/18/16 2302 11/19/16 1955 11/20/16 0318 11/21/16 0500 11/22/16 0534  NA 143 137  --   --  149*  --   K 4.2 4.1  --   --  3.5  --   CL  --  103  --   --  118*  --   CO2 30* 23  --   --  22  --   GLUCOSE 100 145*  --   --  68  --   BUN 16.9 21*  --   --  14  --   CREATININE 0.9 1.47* 1.03* 0.92 0.83 0.81  CALCIUM 9.9 8.8*  --   --  8.3*  --   MG 1.9  --   --   --   --   --    GFR: Estimated Creatinine Clearance: 66.6 mL/min (by C-G formula based on SCr of 0.81 mg/dL). Liver Function Tests:  Recent Labs Lab 11/16/16 1552 11/18/16 2302  AST 25 26  ALT 24 24  ALKPHOS 109 89  BILITOT 1.16 2.2*  PROT 7.6 7.3  ALBUMIN 3.9 3.5    Recent Labs Lab 11/18/16 2302  LIPASE 15   No results for input(s): AMMONIA in the last 168 hours. Coagulation Profile:  Recent Labs Lab 11/18/16 2302  INR 1.42   Cardiac Enzymes: No results for input(s): CKTOTAL, CKMB, CKMBINDEX, TROPONINI in the last 168 hours. BNP (last 3 results) No results for input(s): PROBNP in the last 8760 hours. HbA1C: No results for input(s): HGBA1C in the last 72 hours. CBG:  Recent Labs Lab 11/20/16 0740 11/20/16 0911 11/21/16 0757 11/21/16 0950 11/22/16 0818  GLUCAP 52* 101* 60* 75 89   Lipid Profile: No results for input(s): CHOL, HDL, LDLCALC, TRIG, CHOLHDL, LDLDIRECT in the last 72 hours. Thyroid Function Tests:  Recent  Labs  11/19/16 1955  TSH 0.284*   Anemia Panel: No results for input(s): VITAMINB12, FOLATE, FERRITIN, TIBC, IRON, RETICCTPCT in the last 72 hours. Sepsis Labs:  Recent Labs Lab 11/18/16 2325 11/19/16  0418  LATICACIDVEN 2.41* 1.35    Recent Results (from the past 240 hour(s))  Culture, blood (Routine x 2)     Status: None (Preliminary result)   Collection Time: 11/18/16 11:02 PM  Result Value Ref Range Status   Specimen Description BLOOD CENTRAL LINE  Final   Special Requests   Final    BOTTLES DRAWN AEROBIC AND ANAEROBIC Blood Culture adequate volume   Culture   Final    NO GROWTH 2 DAYS Performed at [facility], [address] [place], [place] [address]    Report Status PENDING  Incomplete  Urine culture     Status: Abnormal   Collection Time: 11/19/16  2:00 AM  Result Value Ref Range Status   Specimen Description URINE, RANDOM  Final   Special Requests NONE  Final   Culture 10,000 COLONIES/mL ENTEROCOCCUS FAECALIS (A)  Final   Report Status 11/22/2016 FINAL  Final   Organism ID, Bacteria ENTEROCOCCUS FAECALIS (A)  Final      Susceptibility   Enterococcus faecalis - MIC*    AMPICILLIN <=2 SENSITIVE Sensitive     LEVOFLOXACIN 1 SENSITIVE Sensitive     NITROFURANTOIN <=16 SENSITIVE Sensitive     VANCOMYCIN 1 SENSITIVE Sensitive     * 10,000 COLONIES/mL ENTEROCOCCUS FAECALIS  MRSA PCR Screening     Status: None   Collection Time: 11/22/16 10:15 AM  Result Value Ref Range Status   MRSA by PCR NEGATIVE NEGATIVE Final    Comment:        The GeneXpert MRSA Assay (FDA approved for NASAL specimens only), is one component of a comprehensive MRSA colonization surveillance program. It is not intended to diagnose MRSA infection nor to guide or monitor treatment for MRSA infections.      Radiology Studies: Dg Chest Port 1 View  Result Date: 11/21/2016 CLINICAL DATA:  51 year old female with 2 days of nausea vomiting and diarrhea. Leukemia undergoing  chemotherapy. EXAM: PORTABLE CHEST 1 VIEW COMPARISON:  11/18/2016. FINDINGS: Portable AP semi upright view at 1624 hours. Stable right chest dual lumen power port. New confluent left lung base opacity obscuring the left hemidiaphragm. Associated left infrahilar air bronchograms. Stable cardiac size and mediastinal contours. Increased pulmonary vascularity. No pneumothorax. Moderate thoracolumbar scoliosis. IMPRESSION: 1. New left lung base consolidation, nonspecific but such as due to pneumonia. Possible small superimposed left pleural effusion. 2. Increased pulmonary vascularity without overt edema. Electronically Signed   By: Genevie Ann M.D.   On: 11/21/2016 17:19    Scheduled Meds: . acyclovir  400 mg Oral Daily  . nystatin  5 mL Oral QID  . polyvinyl alcohol  2 drop Both Eyes Q4H  . prednisoLONE acetate  2 drop Both Eyes Q4H  . promethazine  50 mg Oral Daily   Continuous Infusions: . sodium chloride    . piperacillin-tazobactam (ZOSYN)  IV Stopped (11/22/16 0948)     LOS: 3 days   Jasiyah Paulding, Orpah Melter, MD [facility] Pager [phone]  If 7PM-7AM, please contact night-coverage [email or web address] Password [email or web address] 11/22/2016, 1:25 PM

## 2016-11-22 NOTE — Progress Notes (Signed)
PT Cancellation Note  Patient Details Name: Morgan Waters MRN: [id number] DOB: Jan 20, 1966   Cancelled Treatment:    Reason Eval/Treat Not Completed: Other (comment)patient wants to eat breakfast. Will check back later.   Claretha Cooper 11/22/2016, 9:00 AM Tresa Endo PT [phone]

## 2016-11-23 ENCOUNTER — Other Ambulatory Visit: Payer: Medicaid Other

## 2016-11-23 ENCOUNTER — Telehealth: Payer: Self-pay | Admitting: Hematology

## 2016-11-23 ENCOUNTER — Telehealth: Payer: Self-pay

## 2016-11-23 DIAGNOSIS — A419 Sepsis, unspecified organism: Principal | ICD-10-CM

## 2016-11-23 DIAGNOSIS — R112 Nausea with vomiting, unspecified: Secondary | ICD-10-CM

## 2016-11-23 DIAGNOSIS — R197 Diarrhea, unspecified: Secondary | ICD-10-CM

## 2016-11-23 DIAGNOSIS — C9200 Acute myeloblastic leukemia, not having achieved remission: Secondary | ICD-10-CM

## 2016-11-23 LAB — CBC
HCT: 29.5 % — ABNORMAL LOW (ref 36.0–46.0)
HEMOGLOBIN: 10.4 g/dL — AB (ref 12.0–15.0)
MCH: 29.2 pg (ref 26.0–34.0)
MCHC: 35.3 g/dL (ref 30.0–36.0)
MCV: 82.9 fL (ref 78.0–100.0)
Platelets: 33 10*3/uL — ABNORMAL LOW (ref 150–400)
RBC: 3.56 MIL/uL — ABNORMAL LOW (ref 3.87–5.11)
RDW: 13.7 % (ref 11.5–15.5)
WBC: 13.7 10*3/uL — ABNORMAL HIGH (ref 4.0–10.5)

## 2016-11-23 LAB — GLUCOSE, CAPILLARY: GLUCOSE-CAPILLARY: 100 mg/dL — AB (ref 65–99)

## 2016-11-23 MED ORDER — AMOXICILLIN-POT CLAVULANATE 875-125 MG PO TABS: 1.0000 | ORAL_TABLET | Freq: Two times a day (BID) | ORAL | 0 refills | Status: AC

## 2016-11-23 MED ORDER — HEPARIN SOD (PORK) LOCK FLUSH 100 UNIT/ML IV SOLN
500.0000 [IU] | INTRAVENOUS | Status: AC | PRN
  Administered 2016-11-23: 500 [IU]

## 2016-11-23 NOTE — Care Management Note (Signed)
Case Management Note  Patient Details  Name: Morgan Waters MRN: [id number] Date of Birth: 12-29-1965  Subjective/Objective:  [facility] rep Santiago Glad delivered rw,3n1 to rm prior d/c. No CM needs.                  Action/Plan:d/c home.   Expected Discharge Date:  11/23/16               Expected Discharge Plan:  Home/Self Care  In-House Referral:     Discharge planning Services  CM Consult  Post Acute Care Choice:    Choice offered to:  Patient  DME Arranged:  3-N-1, [place] rolling DME Agency:  [place]:    [place]:     Status of Service:  Completed, signed off  If discussed at [place] of Stay Meetings, dates discussed:    Additional Comments:  Dessa Phi, RN 11/23/2016, 2:10 PM

## 2016-11-23 NOTE — Progress Notes (Signed)
Pharmacy Antibiotic Note  Morgan Waters is a 51 y.o. female admitted on 11/18/2016 with sepsis.  Pharmacy has been consulted for vancomycin and zosyn dosing. Patient receiving HiDAC for AML at [facility] (appears was admitted 9/14 for administration of chemo).  At time of admission, labs reveal AKI and neutropenia.  No fevers noted at admission   Today, 11/23/2016  Day #5 abx  Renal: SCr improved  WBC improved to  No fevers  Plan:  Zosyn 3.375gm IV q8h over 4h infusion with plan to transition to amoxicillin/clav at discharge  Height: 4\' 11"  (149.9 cm) Weight: 126 lb 12.8 oz (57.5 kg) IBW/kg (Calculated) : 43.2  Temp (24hrs), Avg:98.8 F (37.1 C), Min:98.2 F (36.8 C), Max:99.5 F (37.5 C)   Recent Labs Lab 11/18/16 2302 11/18/16 2325 11/19/16 0418  11/19/16 1955 11/20/16 0318 11/20/16 1241 11/21/16 0500 11/22/16 0534 11/22/16 0829 11/23/16 0412  WBC  --   --   --   < > 0.6* 1.1* 2.1* 6.1  --  13.1* 13.7*  CREATININE 1.47*  --   --   --  1.03* 0.92  --  0.83 0.81  --   --   LATICACIDVEN  --  2.41* 1.35  --   --   --   --   --   --   --   --   < > = values in this interval not displayed.  Estimated Creatinine Clearance: 64.9 mL/min (by C-G formula based on SCr of 0.81 mg/dL).    No Known Allergies  Antimicrobials this admission: 9/28 vanco >> 10/2 9/28 zosyn >>  Dose adjustments this admission: 9/30 vanco to 500mg  q12h from 750mg  q24h for improved SCr  Microbiology results: 9/28BCx x1: NGTD 9/29UCx: 10K Enterococcus faecalis-pan-sensitive 10/2 MRSA PCR: negative C. Difficile: not collected, no further diarrhea  Thank you for allowing pharmacy to be a part of this patient's care.  Doreene Eland, PharmD, BCPS.   Pager: [phone] 11/23/2016 8:57 AM

## 2016-11-23 NOTE — Discharge Summary (Addendum)
Physician Discharge Summary  Morgan Waters XBJ:[id number] DOB: January 22, 1966 DOA: 11/18/2016  PCP: Clent Demark, PA-C  Admit date: 11/18/2016 Discharge date: 11/23/2016  Time spent: 35 minutes  Recommendations for Outpatient Follow-up:  Repeat BMET to follow electrolytes and renal function  Repeat CXR in 4-6 weeks to ensure resolution of infiltrates  Repeat CBC to follow cell count trend (WBC's, Hgb and Platelets)  Discharge Diagnoses:  Active Problems:   Other pancytopenia ([facility])   AML (acute myeloblastic leukemia) ([facility])   Sepsis ([place])   AKI (acute kidney injury) ([place])   Gastroenteritis   Dehydration   Fever   Nausea vomiting and diarrhea   Discharge Condition: stable and improved. Discharge home with instructions to be compliant with her medications and to follow up with PCP in 2weeks. Patient will also follow up with oncology service after discharge (appointment will be set up by Paul).  Diet recommendation: regular diet   Filed Weights   11/21/16 0500 11/22/16 0421 11/23/16 0602  Weight: 58.1 kg (128 lb 1.4 oz) 60.6 kg (133 lb 9.6 oz) 57.5 kg (126 lb 12.8 oz)    History of present illness:  51 y.o.femalewith medical history significant for but not limited to leukemia currently undergoing chemotherapy presenting with 2 day history of nausea, vomiting with watery non-bloody and non-mucoid diarrhea stools without fever or chills, abdominal pain, chest pain, shortness of breath, melena or bright red blood per rectum.  Hospital Course:  #1 Nausea/vomiting/diarrhea -No further episodes since admission -Tolerating regular diet -encourage to keep herself well hydrated -PRN antiemetics provided   #2 dehydration: -due to GI loses and poor Po intake in setting of acute illness  -resolved with IVF's -patient eating and drinking properly prior to discharge  -good hydration and nutrition encourage  #3 Acute kidney injury: -Due to dehydration with volume  depletion -resolved with IVF resuscitation -patient advise to keep herself well hydrated and to minimize the use of NSAID's -repeat BMET at follow up visit to assess renal function trend and electrolytes   #4history of leukemia with pancytopenia: -Suspect due to chemotherapy -Given 3 units prbcs this admit with hgb corrected from 6.3 to 10.5 -Outside records reviewed. [facility] recommendation to give 1 unit platelets for plt<20 and 2 units of PRBC's for hgb<9.  -Pt given total 2 units platelets with platelets improved from <5 to 33,000 at discharge -will recommend close follow up of her cell count with repeat CBC at follow up.   #5 Fevers secondary Pneumonia -remained afebrile for 48 hours prior to discharge -CXR findings suggestive of PNA -Pt was treated empirically with vancomycin and Zosyn. Once medically stable and able to tolerate oral medications, plus afebrile for 48 hours antibiotics were transition to Augmentin and patient discharge home with instructions to complete antibiotic therapy.  #6 10,000 colonies of enterococcus Fecalis in urine cx -no dysuria reported -base on sensitivity reports would be cover with current antibiotic therapy use for her PNA. -advise to keep herself well hydrated   Procedures:  See below for x-ray reports   Consultations:  Oncology service   Discharge Exam: Vitals:   11/22/16 2147 11/23/16 0602  BP: (!) 147/87 (!) 164/94  Pulse: 71 69  Resp: 18 18  Temp: 98.8 F (37.1 C) 98.2 F (36.8 C)  SpO2: 98% 98%   General exam: Conversant, in no acute distress Respiratory system: normal chest rise, clear, no audible wheezing Cardiovascular system: regular rhythm, s1-s2 Gastrointestinal system: Nondistended, nontender, pos BS Central nervous system: No seizures, no  tremors Extremities: No cyanosis, no joint deformities Skin: No rashes, no pallor Psychiatry: Affect normal // no auditory hallucinations   Discharge Instructions   Discharge  Instructions    Discharge instructions    Complete by:  As directed    Take medications as prescribed  Arrange follow up with PCP in 2 weeks Follow up with Dr. Burr Medico [facility] will arrange follow up visit for you) Keep yourself well hydrated     Current Discharge Medication List    START taking these medications   Details  amoxicillin-clavulanate (AUGMENTIN) 875-125 MG tablet Take 1 tablet by mouth 2 (two) times daily. Qty: 10 tablet, Refills: 0      CONTINUE these medications which have NOT CHANGED   Details  acyclovir (ZOVIRAX) 400 MG tablet Take 400 mg by mouth daily. Refills: 5    ibuprofen (ADVIL,MOTRIN) 200 MG tablet Take 200 mg by mouth every 6 (six) hours as needed for headache, mild pain or moderate pain.     polyvinyl alcohol (LIQUIFILM TEARS) 1.4 % ophthalmic solution Place 2 drops into both eyes every 4 (four) hours.    prednisoLONE acetate (PRED FORTE) 1 % ophthalmic suspension Place 2 drops into both eyes every 4 (four) hours.    promethazine (PHENERGAN) 25 MG tablet Take 50 mg by mouth daily.    nystatin (MYCOSTATIN) 100000 UNIT/ML suspension Take 5 mLs (500,000 Units total) by mouth 4 (four) times daily. Qty: 500 mL, Refills: 0      STOP taking these medications     levofloxacin (LEVAQUIN) 500 MG tablet        No Known Allergies Follow-up Information    Clent Demark, PA-C. Schedule an appointment as soon as possible for a visit in 2 week(s).   Specialty:  Physician Assistant Contact information: [place] [address] [phone]            The results of significant diagnostics from this hospitalization (including imaging, microbiology, ancillary and laboratory) are listed below for reference.    Significant Diagnostic Studies: Dg Chest Port 1 View  Result Date: 11/21/2016 CLINICAL DATA:  51 year old female with 2 days of nausea vomiting and diarrhea. Leukemia undergoing chemotherapy. EXAM: PORTABLE CHEST 1 VIEW  COMPARISON:  11/18/2016. FINDINGS: Portable AP semi upright view at 1624 hours. Stable right chest dual lumen power port. New confluent left lung base opacity obscuring the left hemidiaphragm. Associated left infrahilar air bronchograms. Stable cardiac size and mediastinal contours. Increased pulmonary vascularity. No pneumothorax. Moderate thoracolumbar scoliosis. IMPRESSION: 1. New left lung base consolidation, nonspecific but such as due to pneumonia. Possible small superimposed left pleural effusion. 2. Increased pulmonary vascularity without overt edema. Electronically Signed   By: Genevie Ann M.D.   On: 11/21/2016 17:19   Dg Chest Port 1 View  Result Date: 11/18/2016 CLINICAL DATA:  Nausea, vomiting, diarrhea. Leukemia with active chemotherapy. EXAM: PORTABLE CHEST 1 VIEW COMPARISON:  None. FINDINGS: Tip of the right chest port in the distal SVC. Lung volumes are low. Borderline cardiomegaly likely accentuated by technique. No consolidation, pulmonary edema or large pleural effusion, left hemidiaphragm obscured by overlying monitoring devices. No pneumothorax. Scoliotic curvature of the spine. IMPRESSION: Low lung volumes with borderline cardiomegaly. Electronically Signed   By: Jeb Levering M.D.   On: 11/18/2016 23:25    Microbiology: Recent Results (from the past 240 hour(s))  Culture, blood (Routine x 2)     Status: None (Preliminary result)   Collection Time: 11/18/16 11:02 PM  Result Value  Ref Range Status   Specimen Description BLOOD CENTRAL LINE  Final   Special Requests   Final    BOTTLES DRAWN AEROBIC AND ANAEROBIC Blood Culture adequate volume   Culture   Final    NO GROWTH 3 DAYS Performed at [facility], [address], [place], [place] [address]    Report Status PENDING  Incomplete  Urine culture     Status: Abnormal   Collection Time: 11/19/16  2:00 AM  Result Value Ref Range Status   Specimen Description URINE, RANDOM  Final   Special Requests NONE  Final    Culture 10,000 COLONIES/mL ENTEROCOCCUS FAECALIS (A)  Final   Report Status 11/22/2016 FINAL  Final   Organism ID, Bacteria ENTEROCOCCUS FAECALIS (A)  Final      Susceptibility   Enterococcus faecalis - MIC*    AMPICILLIN <=2 SENSITIVE Sensitive     LEVOFLOXACIN 1 SENSITIVE Sensitive     NITROFURANTOIN <=16 SENSITIVE Sensitive     VANCOMYCIN 1 SENSITIVE Sensitive     * 10,000 COLONIES/mL ENTEROCOCCUS FAECALIS  MRSA PCR Screening     Status: None   Collection Time: 11/22/16 10:15 AM  Result Value Ref Range Status   MRSA by PCR NEGATIVE NEGATIVE Final    Comment:        The GeneXpert MRSA Assay (FDA approved for NASAL specimens only), is one component of a comprehensive MRSA colonization surveillance program. It is not intended to diagnose MRSA infection nor to guide or monitor treatment for MRSA infections.      Labs: Basic Metabolic Panel:  Recent Labs Lab 11/16/16 1552 11/18/16 2302 11/19/16 1955 11/20/16 0318 11/21/16 0500 11/22/16 0534  NA 143 137  --   --  149*  --   K 4.2 4.1  --   --  3.5  --   CL  --  103  --   --  118*  --   CO2 30* 23  --   --  22  --   GLUCOSE 100 145*  --   --  68  --   BUN 16.9 21*  --   --  14  --   CREATININE 0.9 1.47* 1.03* 0.92 0.83 0.81  CALCIUM 9.9 8.8*  --   --  8.3*  --   MG 1.9  --   --   --   --   --    Liver Function Tests:  Recent Labs Lab 11/16/16 1552 11/18/16 2302  AST 25 26  ALT 24 24  ALKPHOS 109 89  BILITOT 1.16 2.2*  PROT 7.6 7.3  ALBUMIN 3.9 3.5    Recent Labs Lab 11/18/16 2302  LIPASE 15   CBC:  Recent Labs Lab 11/16/16 1552  11/20/16 0318 11/20/16 1241 11/21/16 0500 11/22/16 0829 11/23/16 0412  WBC 0.3*  < > 1.1* 2.1* 6.1 13.1* 13.7*  NEUTROABS 0.0*  --  0.5*  --   --   --   --   HGB 9.7*  < > 6.3* 8.3* 10.5* 10.7* 10.4*  HCT 28.1*  < > 17.5* 23.3* 29.6* 29.6* 29.5*  MCV 87.8  < > 84.1 83.2 83.4 80.7 82.9  PLT 9*  < > <5* 6* 9* 21* 33*  < > = values in this interval not  displayed.   CBG:  Recent Labs Lab 11/20/16 0911 11/21/16 0757 11/21/16 0950 11/22/16 0818 11/23/16 0809  GLUCAP 101* 60* 75 89 100*     Signed:  Barton Dubois MD.  [facility] 11/23/2016, 1:50 PM

## 2016-11-23 NOTE — Telephone Encounter (Signed)
Called to verifiy to patient , Dr Burr Medico said she will not be needing blood due to her getting it in the hospital on (10/1 and 10/2). Per 10/3 sch message

## 2016-11-24 ENCOUNTER — Other Ambulatory Visit: Payer: Medicaid Other

## 2016-11-24 LAB — CULTURE, BLOOD (ROUTINE X 2)
CULTURE: NO GROWTH
SPECIAL REQUESTS: ADEQUATE

## 2016-11-28 ENCOUNTER — Telehealth: Payer: Self-pay | Admitting: *Deleted

## 2016-11-28 ENCOUNTER — Other Ambulatory Visit (HOSPITAL_BASED_OUTPATIENT_CLINIC_OR_DEPARTMENT_OTHER): Payer: Medicaid Other

## 2016-11-28 DIAGNOSIS — C9201 Acute myeloblastic leukemia, in remission: Secondary | ICD-10-CM

## 2016-11-28 DIAGNOSIS — C9200 Acute myeloblastic leukemia, not having achieved remission: Secondary | ICD-10-CM

## 2016-11-28 LAB — COMPREHENSIVE METABOLIC PANEL
ALK PHOS: 80 U/L (ref 40–150)
ALT: 8 U/L (ref 0–55)
AST: 20 U/L (ref 5–34)
Albumin: 3.2 g/dL — ABNORMAL LOW (ref 3.5–5.0)
Anion Gap: 10 mEq/L (ref 3–11)
BUN: 4.7 mg/dL — ABNORMAL LOW (ref 7.0–26.0)
CHLORIDE: 107 meq/L (ref 98–109)
CO2: 26 meq/L (ref 22–29)
Calcium: 8.8 mg/dL (ref 8.4–10.4)
Creatinine: 0.8 mg/dL (ref 0.6–1.1)
GLUCOSE: 82 mg/dL (ref 70–140)
SODIUM: 143 meq/L (ref 136–145)
Total Bilirubin: 0.61 mg/dL (ref 0.20–1.20)
Total Protein: 6.2 g/dL — ABNORMAL LOW (ref 6.4–8.3)

## 2016-11-28 LAB — CBC WITH DIFFERENTIAL/PLATELET
BASO%: 0.5 % (ref 0.0–2.0)
BASOS ABS: 0 10*3/uL (ref 0.0–0.1)
EOS ABS: 0 10*3/uL (ref 0.0–0.5)
EOS%: 0.4 % (ref 0.0–7.0)
HCT: 32.9 % — ABNORMAL LOW (ref 34.8–46.6)
HGB: 11.1 g/dL — ABNORMAL LOW (ref 11.6–15.9)
LYMPH%: 10.3 % — AB (ref 14.0–49.7)
MCH: 29.6 pg (ref 25.1–34.0)
MCHC: 33.6 g/dL (ref 31.5–36.0)
MCV: 88.2 fL (ref 79.5–101.0)
MONO#: 0.3 10*3/uL (ref 0.1–0.9)
MONO%: 7.3 % (ref 0.0–14.0)
NEUT#: 3.3 10*3/uL (ref 1.5–6.5)
NEUT%: 81.5 % — ABNORMAL HIGH (ref 38.4–76.8)
Platelets: 169 10*3/uL (ref 145–400)
RBC: 3.74 10*6/uL (ref 3.70–5.45)
RDW: 13.8 % (ref 11.2–14.5)
WBC: 4 10*3/uL (ref 3.9–10.3)
lymph#: 0.4 10*3/uL — ABNORMAL LOW (ref 0.9–3.3)

## 2016-11-28 LAB — MAGNESIUM: Magnesium: 1.8 mg/dl (ref 1.5–2.5)

## 2016-11-28 NOTE — Telephone Encounter (Signed)
Dr. Burr Medico reviewed lab results today.  K+  3.0 .  Attempted to call pt with no answer.  Left message on voice mail requesting pt to call nurse back early am 11/29/16 for further instructions from Dr. Burr Medico.

## 2016-11-29 ENCOUNTER — Other Ambulatory Visit: Payer: Self-pay | Admitting: *Deleted

## 2016-11-29 DIAGNOSIS — E876 Hypokalemia: Secondary | ICD-10-CM

## 2016-11-29 MED ORDER — POTASSIUM CHLORIDE CRYS ER 20 MEQ PO TBCR: 20.0000 meq | EXTENDED_RELEASE_TABLET | ORAL | 1 refills | Status: AC

## 2016-11-29 NOTE — Telephone Encounter (Signed)
Spoke with pt this am, and informed pt of low potassium level from labs done yesterday.  Instructed pt to start taking Kdur  40 meq daily for 4 days, then take 20 meq daily until further instructions from md.  Gave pt dietary foods suggestions that are high in potassium.  Pt voiced understanding.

## 2016-12-01 ENCOUNTER — Other Ambulatory Visit: Payer: Medicaid Other

## 2016-12-01 ENCOUNTER — Other Ambulatory Visit: Payer: Self-pay | Admitting: Hematology

## 2016-12-02 ENCOUNTER — Other Ambulatory Visit (HOSPITAL_BASED_OUTPATIENT_CLINIC_OR_DEPARTMENT_OTHER): Payer: Medicaid Other

## 2016-12-02 DIAGNOSIS — C9201 Acute myeloblastic leukemia, in remission: Secondary | ICD-10-CM | POA: Diagnosis present

## 2016-12-02 DIAGNOSIS — C9200 Acute myeloblastic leukemia, not having achieved remission: Secondary | ICD-10-CM

## 2016-12-02 LAB — COMPREHENSIVE METABOLIC PANEL
ALBUMIN: 3.6 g/dL (ref 3.5–5.0)
ALK PHOS: 85 U/L (ref 40–150)
ALT: 9 U/L (ref 0–55)
ANION GAP: 9 meq/L (ref 3–11)
AST: 14 U/L (ref 5–34)
BILIRUBIN TOTAL: 0.61 mg/dL (ref 0.20–1.20)
BUN: 6.2 mg/dL — ABNORMAL LOW (ref 7.0–26.0)
CO2: 26 mEq/L (ref 22–29)
Calcium: 9.3 mg/dL (ref 8.4–10.4)
Chloride: 108 mEq/L (ref 98–109)
Creatinine: 0.8 mg/dL (ref 0.6–1.1)
Glucose: 82 mg/dl (ref 70–140)
Potassium: 3.6 mEq/L (ref 3.5–5.1)
Sodium: 143 mEq/L (ref 136–145)
TOTAL PROTEIN: 6.8 g/dL (ref 6.4–8.3)

## 2016-12-02 LAB — CBC WITH DIFFERENTIAL/PLATELET
BASO%: 0.4 % (ref 0.0–2.0)
BASOS ABS: 0 10*3/uL (ref 0.0–0.1)
EOS ABS: 0 10*3/uL (ref 0.0–0.5)
EOS%: 0.4 % (ref 0.0–7.0)
HCT: 35.6 % (ref 34.8–46.6)
HEMOGLOBIN: 11.8 g/dL (ref 11.6–15.9)
LYMPH%: 21.4 % (ref 14.0–49.7)
MCH: 29.8 pg (ref 25.1–34.0)
MCHC: 33.1 g/dL (ref 31.5–36.0)
MCV: 89.9 fL (ref 79.5–101.0)
MONO#: 0.1 10*3/uL (ref 0.1–0.9)
MONO%: 5.2 % (ref 0.0–14.0)
NEUT%: 72.6 % (ref 38.4–76.8)
NEUTROS ABS: 1.8 10*3/uL (ref 1.5–6.5)
PLATELETS: 209 10*3/uL (ref 145–400)
RBC: 3.96 10*6/uL (ref 3.70–5.45)
RDW: 16 % — ABNORMAL HIGH (ref 11.2–14.5)
WBC: 2.5 10*3/uL — AB (ref 3.9–10.3)
lymph#: 0.5 10*3/uL — ABNORMAL LOW (ref 0.9–3.3)

## 2016-12-02 LAB — MAGNESIUM: Magnesium: 1.8 mg/dl (ref 1.5–2.5)

## 2016-12-05 ENCOUNTER — Other Ambulatory Visit: Payer: Medicaid Other

## 2016-12-05 ENCOUNTER — Other Ambulatory Visit (HOSPITAL_BASED_OUTPATIENT_CLINIC_OR_DEPARTMENT_OTHER): Payer: Medicaid Other

## 2016-12-05 DIAGNOSIS — C9201 Acute myeloblastic leukemia, in remission: Secondary | ICD-10-CM | POA: Diagnosis not present

## 2016-12-05 DIAGNOSIS — C9200 Acute myeloblastic leukemia, not having achieved remission: Secondary | ICD-10-CM

## 2016-12-05 LAB — COMPREHENSIVE METABOLIC PANEL
ALT: 10 U/L (ref 0–55)
AST: 15 U/L (ref 5–34)
Albumin: 4 g/dL (ref 3.5–5.0)
Alkaline Phosphatase: 86 U/L (ref 40–150)
Anion Gap: 9 mEq/L (ref 3–11)
BUN: 10.7 mg/dL (ref 7.0–26.0)
CHLORIDE: 106 meq/L (ref 98–109)
CO2: 26 meq/L (ref 22–29)
CREATININE: 0.8 mg/dL (ref 0.6–1.1)
Calcium: 9.8 mg/dL (ref 8.4–10.4)
EGFR: >60 mL/min/{1.73_m2} (ref >60)
GLUCOSE: 101 mg/dL (ref 70–140)
POTASSIUM: 3.8 meq/L (ref 3.5–5.1)
SODIUM: 141 meq/L (ref 136–145)
Total Bilirubin: 1.14 mg/dL (ref 0.20–1.20)
Total Protein: 7.5 g/dL (ref 6.4–8.3)

## 2016-12-05 LAB — CBC WITH DIFFERENTIAL/PLATELET
BASO%: 0.3 % (ref 0.0–2.0)
BASOS ABS: 0 10*3/uL (ref 0.0–0.1)
EOS ABS: 0 10*3/uL (ref 0.0–0.5)
EOS%: 0 % (ref 0.0–7.0)
HCT: 38.4 % (ref 34.8–46.6)
HGB: 12.7 g/dL (ref 11.6–15.9)
LYMPH%: 18.2 % (ref 14.0–49.7)
MCH: 30 pg (ref 25.1–34.0)
MCHC: 33.1 g/dL (ref 31.5–36.0)
MCV: 90.8 fL (ref 79.5–101.0)
MONO#: 0.1 10*3/uL (ref 0.1–0.9)
MONO%: 4.5 % (ref 0.0–14.0)
NEUT#: 2.4 10*3/uL (ref 1.5–6.5)
NEUT%: 77 % — ABNORMAL HIGH (ref 38.4–76.8)
PLATELETS: 199 10*3/uL (ref 145–400)
RBC: 4.23 10*6/uL (ref 3.70–5.45)
RDW: 16.2 % — ABNORMAL HIGH (ref 11.2–14.5)
WBC: 3.1 10*3/uL — ABNORMAL LOW (ref 3.9–10.3)
lymph#: 0.6 10*3/uL — ABNORMAL LOW (ref 0.9–3.3)

## 2016-12-05 LAB — MAGNESIUM: MAGNESIUM: 1.9 mg/dL (ref 1.5–2.5)

## 2016-12-06 ENCOUNTER — Telehealth: Payer: Self-pay | Admitting: *Deleted

## 2016-12-06 NOTE — Telephone Encounter (Signed)
Voicemail:  "I am having headaches.  Tylenol is not working.  Please call [phone]."   Message left on voicemail identified as patient requesting return call for further assessment.  Routing call information to APP for review.  12-05-2016 lab results K+, WBC, Neutrophils and Pltc. all WNL  Chronic history of migraines.  No F/U appointments at this time.

## 2016-12-07 ENCOUNTER — Encounter (INDEPENDENT_AMBULATORY_CARE_PROVIDER_SITE_OTHER): Payer: Self-pay | Admitting: Physician Assistant

## 2016-12-07 ENCOUNTER — Ambulatory Visit (INDEPENDENT_AMBULATORY_CARE_PROVIDER_SITE_OTHER): Payer: Medicaid Other | Admitting: Physician Assistant

## 2016-12-07 VITALS — BP 121/83 | HR 68 | Temp 97.8°F | Wt 117.6 lb

## 2016-12-07 DIAGNOSIS — R51 Headache: Secondary | ICD-10-CM

## 2016-12-07 DIAGNOSIS — R519 Headache, unspecified: Secondary | ICD-10-CM

## 2016-12-07 DIAGNOSIS — R197 Diarrhea, unspecified: Secondary | ICD-10-CM | POA: Diagnosis not present

## 2016-12-07 MED ORDER — TOPIRAMATE 25 MG PO TABS: 25.0000 mg | ORAL_TABLET | Freq: Every day | ORAL | 2 refills | Status: AC

## 2016-12-07 MED ORDER — LOPERAMIDE HCL 2 MG PO TABS: 2.0000 mg | ORAL_TABLET | Freq: Four times a day (QID) | ORAL | 0 refills | Status: DC | PRN

## 2016-12-07 NOTE — Progress Notes (Signed)
Subjective:  Patient ID: Morgan Waters, female    DOB: Mar 23, 1965  Age: 51 y.o. MRN: [id number]  CC: headache, diarrhea  HPI Morgan Waters is a 51 y.o. female with a medical history of Acute Myeloid leukemia and migraines presents with headache and diarrhea. Currently receiving treatment for AML at [facility]. Had received an infusion of Neulasta on 11/17/16 which caused vomiting shortly after infusion. Went to the ED the next day and diagnosed with dehydration, hypotension, nausea, and vomiting. Patient felt better after leaving ED. Reports having diarrhea x6 BMs since 5 days ago. Has not had diarrhea today yet. Associated with poor appetite and fatigue. Does not endorse endorse blood in stool, abdominal pain, abdominal bloating.     Frontal headache onset 5 days ago. Headache usually lasts 24 hrs. 5-6 headaches per month. Headache feels as if "someone is beating me over the head". Throbbing pain, sharp, and associated with visual blurring. Better with rest in a dark room. Has taken Aleve with mixed result. Tylenol is not helpful.        Outpatient Medications Prior to Visit  Medication Sig Dispense Refill  . acyclovir (ZOVIRAX) 400 MG tablet Take 400 mg by mouth daily.  5  . amoxicillin-clavulanate (AUGMENTIN) 875-125 MG tablet Take 1 tablet by mouth 2 (two) times daily. 10 tablet 0  . potassium chloride SA (K-DUR,KLOR-CON) 20 MEQ tablet Take 1 tablet (20 mEq total) by mouth as directed. Take 40 meq daily for 4 days, then take 20 meq daily until further instructions. 30 tablet 1  . promethazine (PHENERGAN) 25 MG tablet Take 50 mg by mouth daily.    Marland Kitchen ibuprofen (ADVIL,MOTRIN) 200 MG tablet Take 200 mg by mouth every 6 (six) hours as needed for headache, mild pain or moderate pain.     Marland Kitchen nystatin (MYCOSTATIN) 100000 UNIT/ML suspension Take 5 mLs (500,000 Units total) by mouth 4 (four) times daily. (Patient not taking: Reported on 12/07/2016) 500 mL 0  . polyvinyl  alcohol (LIQUIFILM TEARS) 1.4 % ophthalmic solution Place 2 drops into both eyes every 4 (four) hours.    . prednisoLONE acetate (PRED FORTE) 1 % ophthalmic suspension Place 2 drops into both eyes every 4 (four) hours.     Facility-Administered Medications Prior to Visit  Medication Dose Route Frequency Provider Last Rate Last Dose  . 0.9 %  sodium chloride infusion  250 mL Intravenous Once Truitt Merle, MD      . 0.9 %  sodium chloride infusion  250 mL Intravenous Once Truitt Merle, MD      . acetaminophen (TYLENOL) tablet 650 mg  650 mg Oral Once Truitt Merle, MD      . diphenhydrAMINE (BENADRYL) capsule 25 mg  25 mg Oral Once Truitt Merle, MD      . heparin lock flush 100 unit/mL  500 Units Intracatheter Daily PRN Truitt Merle, MD      . heparin lock flush 100 unit/mL  250 Units Intracatheter PRN Truitt Merle, MD      . sodium chloride flush (NS) 0.9 % injection 10 mL  10 mL Intracatheter PRN Truitt Merle, MD      . sodium chloride flush (NS) 0.9 % injection 3 mL  3 mL Intracatheter PRN Truitt Merle, MD         ROS Review of Systems  Constitutional: Negative for chills, fever and malaise/fatigue.  Eyes: Negative for blurred vision.  Respiratory: Negative for shortness of breath.   Cardiovascular: Negative for chest  pain and palpitations.  Gastrointestinal: Positive for diarrhea. Negative for abdominal pain and nausea.  Genitourinary: Negative for dysuria and hematuria.  Musculoskeletal: Negative for joint pain and myalgias.  Skin: Negative for rash.  Neurological: Positive for headaches. Negative for tingling.  Psychiatric/Behavioral: Negative for depression. The patient is not nervous/anxious.     Objective:  BP 121/83 (BP Location: Left Arm, Patient Position: Sitting, Cuff Size: Normal)   Pulse 68   Temp 97.8 F (36.6 C) (Oral)   Wt 117 lb 9.6 oz (53.3 kg)   SpO2 100%   BMI 23.75 kg/m   BP/Weight 12/07/2016 11/23/2016 4/40/1027  Systolic BP 253 664 403  Diastolic BP 83 94 85  Wt. (Lbs) 117.6  126.8 121.8  BMI 23.75 25.61 24.6      Physical Exam  Constitutional: She is oriented to person, place, and time.  Well developed, well nourished, NAD, polite  HENT:  Head: Normocephalic and atraumatic.  Eyes: Pupils are equal, round, and reactive to light. Conjunctivae are normal. No scleral icterus.  Neck: Normal range of motion. Neck supple. No thyromegaly present.  Cardiovascular: Normal rate, regular rhythm and normal heart sounds.   No carotid bruit bilaterally  Pulmonary/Chest: Effort normal and breath sounds normal. No respiratory distress. She has no wheezes.  Abdominal: Soft. Bowel sounds are normal. She exhibits no distension and no mass. There is no tenderness. There is no rebound and no guarding.  Musculoskeletal: She exhibits no edema.  Lymphadenopathy:    She has no cervical adenopathy.  Neurological: She is alert and oriented to person, place, and time. No cranial nerve deficit. Coordination normal.  Skin: Skin is warm and dry. No rash noted. No erythema. No pallor.  Psychiatric: She has a normal mood and affect. Her behavior is normal. Thought content normal.  Vitals reviewed.    Assessment & Plan:    1. Nonintractable headache, unspecified chronicity pattern, unspecified headache type - Begin topiramate (TOPAMAX) 25 MG tablet; Take 1 tablet (25 mg total) by mouth daily.  Dispense: 30 tablet; Refill: 2  2. Diarrhea, unspecified type - Diarrhea reportedly began after infusion of Neulasta but drug profile states <1% of patients experienced diarrhea. No current red flags. No diarrhea today.  - Begin loperamide (IMODIUM A-D) 2 MG tablet; Take 1 tablet (2 mg total) by mouth 4 (four) times daily as needed for diarrhea or loose stools.  Dispense: 30 tablet; Refill: 0   Meds ordered this encounter  Medications  . topiramate (TOPAMAX) 25 MG tablet    Sig: Take 1 tablet (25 mg total) by mouth daily.    Dispense:  30 tablet    Refill:  2    Order Specific Question:    Supervising Provider    Answer:   Tresa Garter [id number]  . loperamide (IMODIUM A-D) 2 MG tablet    Sig: Take 1 tablet (2 mg total) by mouth 4 (four) times daily as needed for diarrhea or loose stools.    Dispense:  30 tablet    Refill:  0    Order Specific Question:   Supervising Provider    Answer:   Tresa Garter [id number]    Follow-up: Return in about 4 weeks (around 01/04/2017) for headache.   Clent Demark PA

## 2016-12-07 NOTE — Telephone Encounter (Addendum)
Verbal order received and read back from Cira Rue NP for Imodium for diarrhea.  Order given to patient at this time.  "I just saw Dr. [name], Imodium is what he just ordered as well.  No diarrhea yet today." Encouraged to begin imodium after first diarrhea stool if any diarrhea returns.   Note a new order today for Topiramate 25 mg for migraine prevention.

## 2016-12-07 NOTE — Patient Instructions (Signed)
Migraine Headache A migraine headache is an intense, throbbing pain on one side or both sides of the head. Migraines may also cause other symptoms, such as nausea, vomiting, and sensitivity to light and noise. What are the causes? Doing or taking certain things may also trigger migraines, such as:  Alcohol.  Smoking.  Medicines, such as: ? Medicine used to treat chest pain (nitroglycerine). ? Birth control pills. ? Estrogen pills. ? Certain blood pressure medicines.  Aged cheeses, chocolate, or caffeine.  Foods or drinks that contain nitrates, glutamate, aspartame, or tyramine.  Physical activity.  Other things that may trigger a migraine include:  Menstruation.  Pregnancy.  Hunger.  Stress, lack of sleep, too much sleep, or fatigue.  Weather changes.  What increases the risk? The following factors may make you more likely to experience migraine headaches:  Age. Risk increases with age.  Family history of migraine headaches.  Being Caucasian.  Depression and anxiety.  Obesity.  Being a woman.  Having a hole in the heart (patent foramen ovale) or other heart problems.  What are the signs or symptoms? The main symptom of this condition is pulsating or throbbing pain. Pain may:  Happen in any area of the head, such as on one side or both sides.  Interfere with daily activities.  Get worse with physical activity.  Get worse with exposure to bright lights or loud noises.  Other symptoms may include:  Nausea.  Vomiting.  Dizziness.  General sensitivity to bright lights, loud noises, or smells.  Before you get a migraine, you may get warning signs that a migraine is developing (aura). An aura may include:  Seeing flashing lights or having blind spots.  Seeing bright spots, halos, or zigzag lines.  Having tunnel vision or blurred vision.  Having numbness or a tingling feeling.  Having trouble talking.  Having muscle weakness.  How is this  diagnosed? A migraine headache can be diagnosed based on:  Your symptoms.  A physical exam.  Tests, such as CT scan or MRI of the head. These imaging tests can help rule out other causes of headaches.  Taking fluid from the spine (lumbar puncture) and analyzing it (cerebrospinal fluid analysis, or CSF analysis).  How is this treated? A migraine headache is usually treated with medicines that:  Relieve pain.  Relieve nausea.  Prevent migraines from coming back.  Treatment may also include:  Acupuncture.  Lifestyle changes like avoiding foods that trigger migraines.  Follow these instructions at home: Medicines  Take over-the-counter and prescription medicines only as told by your health care provider.  Do not drive or use heavy machinery while taking prescription pain medicine.  To prevent or treat constipation while you are taking prescription pain medicine, your health care provider may recommend that you: ? Drink enough fluid to keep your urine clear or pale yellow. ? Take over-the-counter or prescription medicines. ? Eat foods that are high in fiber, such as fresh fruits and vegetables, whole grains, and beans. ? Limit foods that are high in fat and processed sugars, such as fried and sweet foods. Lifestyle  Avoid alcohol use.  Do not use any products that contain nicotine or tobacco, such as cigarettes and e-cigarettes. If you need help quitting, ask your health care provider.  Get at least 8 hours of sleep every night.  Limit your stress. General instructions   Keep a journal to find out what may trigger your migraine headaches. For example, write down: ? What you eat and   drink. ? How much sleep you get. ? Any change to your diet or medicines.  If you have a migraine: ? Avoid things that make your symptoms worse, such as bright lights. ? It may help to lie down in a dark, quiet room. ? Do not drive or use heavy machinery. ? Ask your health care provider  what activities are safe for you while you are experiencing symptoms.  Keep all follow-up visits as told by your health care provider. This is important. Contact a health care provider if:  You develop symptoms that are different or more severe than your usual migraine symptoms. Get help right away if:  Your migraine becomes severe.  You have a fever.  You have a stiff neck.  You have vision loss.  Your muscles feel weak or like you cannot control them.  You start to lose your balance often.  You develop trouble walking.  You faint. This information is not intended to replace advice given to you by your health care provider. Make sure you discuss any questions you have with your health care provider. Document Released: 02/07/2005 Document Revised: 08/28/2015 Document Reviewed: 07/27/2015 Elsevier Interactive Patient Education  2017 [employer]   

## 2016-12-07 NOTE — Telephone Encounter (Signed)
No return call received from patient. Verbal order received and read back from Cira Rue NP yesterday for patient to resume Aleve for headaches.  Order given to Ms. Sockwell at this time.   with today's call received report of "diarrhea started yesterday at 9:00 am.  I was waiting for you all to call to let you know about diarrhea.  From 9:00 am until 9:00 pm had six light brown watery stools.  I had to go really fast, stopped while driving to get to a bathroom several times.  At 9:00 pm I had a soft formed brown stool and no more diarrhea or BM's since last night.  Have not taken any OTC imodium.  My mid abdomen hurts still today at a level six on pain scale.  Yesterday I hurt really bad at a level nine.  I couldn't eat Monday.  Last vomited on Saturday.  Three weeks ago I was in the hospital with dehydration."     Routing call information to A.P.P. provider for review and any further patient communication.

## 2016-12-08 ENCOUNTER — Other Ambulatory Visit (HOSPITAL_BASED_OUTPATIENT_CLINIC_OR_DEPARTMENT_OTHER): Payer: Medicaid Other

## 2016-12-08 DIAGNOSIS — C9201 Acute myeloblastic leukemia, in remission: Secondary | ICD-10-CM

## 2016-12-08 DIAGNOSIS — C9200 Acute myeloblastic leukemia, not having achieved remission: Secondary | ICD-10-CM

## 2016-12-08 LAB — CBC WITH DIFFERENTIAL/PLATELET
BASO%: 0.5 % (ref 0.0–2.0)
Basophils Absolute: 0 10*3/uL (ref 0.0–0.1)
EOS%: 0.3 % (ref 0.0–7.0)
Eosinophils Absolute: 0 10*3/uL (ref 0.0–0.5)
HCT: 36.8 % (ref 34.8–46.6)
HGB: 12.1 g/dL (ref 11.6–15.9)
LYMPH%: 17.8 % (ref 14.0–49.7)
MCH: 29.7 pg (ref 25.1–34.0)
MCHC: 32.7 g/dL (ref 31.5–36.0)
MCV: 90.8 fL (ref 79.5–101.0)
MONO#: 0.2 10*3/uL (ref 0.1–0.9)
MONO%: 5.6 % (ref 0.0–14.0)
NEUT#: 2.4 10*3/uL (ref 1.5–6.5)
NEUT%: 75.8 % (ref 38.4–76.8)
Platelets: 156 10*3/uL (ref 145–400)
RBC: 4.06 10*6/uL (ref 3.70–5.45)
RDW: 16.8 % — ABNORMAL HIGH (ref 11.2–14.5)
WBC: 3.2 10*3/uL — ABNORMAL LOW (ref 3.9–10.3)
lymph#: 0.6 10*3/uL — ABNORMAL LOW (ref 0.9–3.3)

## 2016-12-08 LAB — COMPREHENSIVE METABOLIC PANEL
ALT: 8 U/L (ref 0–55)
AST: 16 U/L (ref 5–34)
Albumin: 3.8 g/dL (ref 3.5–5.0)
Alkaline Phosphatase: 86 U/L (ref 40–150)
Anion Gap: 9 mEq/L (ref 3–11)
BUN: 8.2 mg/dL (ref 7.0–26.0)
CHLORIDE: 109 meq/L (ref 98–109)
CO2: 27 meq/L (ref 22–29)
CREATININE: 0.8 mg/dL (ref 0.6–1.1)
Calcium: 9.4 mg/dL (ref 8.4–10.4)
EGFR: >60 mL/min/{1.73_m2} (ref >60)
Glucose: 109 mg/dl (ref 70–140)
POTASSIUM: 4.8 meq/L (ref 3.5–5.1)
Sodium: 144 mEq/L (ref 136–145)
Total Bilirubin: 0.71 mg/dL (ref 0.20–1.20)
Total Protein: 7.1 g/dL (ref 6.4–8.3)

## 2016-12-08 LAB — MAGNESIUM: Magnesium: 2.1 mg/dl (ref 1.5–2.5)

## 2016-12-12 ENCOUNTER — Other Ambulatory Visit (HOSPITAL_BASED_OUTPATIENT_CLINIC_OR_DEPARTMENT_OTHER): Payer: Medicaid Other

## 2016-12-12 DIAGNOSIS — C9200 Acute myeloblastic leukemia, not having achieved remission: Secondary | ICD-10-CM

## 2016-12-12 DIAGNOSIS — C9201 Acute myeloblastic leukemia, in remission: Secondary | ICD-10-CM

## 2016-12-12 LAB — CBC WITH DIFFERENTIAL/PLATELET
BASO%: 0.6 % (ref 0.0–2.0)
BASOS ABS: 0 10*3/uL (ref 0.0–0.1)
EOS%: 0.6 % (ref 0.0–7.0)
Eosinophils Absolute: 0 10*3/uL (ref 0.0–0.5)
HCT: 39 % (ref 34.8–46.6)
HGB: 13 g/dL (ref 11.6–15.9)
LYMPH%: 20.5 % (ref 14.0–49.7)
MCH: 29.7 pg (ref 25.1–34.0)
MCHC: 33.3 g/dL (ref 31.5–36.0)
MCV: 89.2 fL (ref 79.5–101.0)
MONO#: 0.1 10*3/uL (ref 0.1–0.9)
MONO%: 3.4 % (ref 0.0–14.0)
NEUT#: 2.5 10*3/uL (ref 1.5–6.5)
NEUT%: 74.9 % (ref 38.4–76.8)
Platelets: 138 10*3/uL — ABNORMAL LOW (ref 145–400)
RBC: 4.37 10*6/uL (ref 3.70–5.45)
RDW: 16.7 % — AB (ref 11.2–14.5)
WBC: 3.4 10*3/uL — ABNORMAL LOW (ref 3.9–10.3)
lymph#: 0.7 10*3/uL — ABNORMAL LOW (ref 0.9–3.3)

## 2016-12-12 LAB — COMPREHENSIVE METABOLIC PANEL
ALT: 13 U/L (ref 0–55)
AST: 19 U/L (ref 5–34)
Albumin: 4.1 g/dL (ref 3.5–5.0)
Alkaline Phosphatase: 86 U/L (ref 40–150)
Anion Gap: 10 mEq/L (ref 3–11)
BUN: 17.1 mg/dL (ref 7.0–26.0)
CHLORIDE: 107 meq/L (ref 98–109)
CO2: 24 meq/L (ref 22–29)
Calcium: 9.8 mg/dL (ref 8.4–10.4)
Creatinine: 0.9 mg/dL (ref 0.6–1.1)
EGFR: >60 mL/min/{1.73_m2} (ref >60)
GLUCOSE: 106 mg/dL (ref 70–140)
POTASSIUM: 4.1 meq/L (ref 3.5–5.1)
SODIUM: 141 meq/L (ref 136–145)
Total Bilirubin: 1.18 mg/dL (ref 0.20–1.20)
Total Protein: 7.5 g/dL (ref 6.4–8.3)

## 2016-12-12 LAB — MAGNESIUM: Magnesium: 1.9 mg/dl (ref 1.5–2.5)

## 2016-12-15 ENCOUNTER — Other Ambulatory Visit (HOSPITAL_BASED_OUTPATIENT_CLINIC_OR_DEPARTMENT_OTHER): Payer: Medicaid Other

## 2016-12-15 DIAGNOSIS — C9201 Acute myeloblastic leukemia, in remission: Secondary | ICD-10-CM

## 2016-12-15 DIAGNOSIS — C9200 Acute myeloblastic leukemia, not having achieved remission: Secondary | ICD-10-CM

## 2016-12-15 LAB — CBC WITH DIFFERENTIAL/PLATELET
BASO%: 0.7 % (ref 0.0–2.0)
BASOS ABS: 0 10*3/uL (ref 0.0–0.1)
EOS ABS: 0 10*3/uL (ref 0.0–0.5)
EOS%: 0.7 % (ref 0.0–7.0)
HEMATOCRIT: 35.9 % (ref 34.8–46.6)
HGB: 12.2 g/dL (ref 11.6–15.9)
LYMPH%: 39.7 % (ref 14.0–49.7)
MCH: 30.4 pg (ref 25.1–34.0)
MCHC: 34 g/dL (ref 31.5–36.0)
MCV: 89.5 fL (ref 79.5–101.0)
MONO#: 0.1 10*3/uL (ref 0.1–0.9)
MONO%: 4.9 % (ref 0.0–14.0)
NEUT#: 1.4 10*3/uL — ABNORMAL LOW (ref 1.5–6.5)
NEUT%: 54 % (ref 38.4–76.8)
PLATELETS: 109 10*3/uL — AB (ref 145–400)
RBC: 4.01 10*6/uL (ref 3.70–5.45)
RDW: 15.8 % — ABNORMAL HIGH (ref 11.2–14.5)
WBC: 2.7 10*3/uL — ABNORMAL LOW (ref 3.9–10.3)
lymph#: 1.1 10*3/uL (ref 0.9–3.3)
nRBC: 0 % (ref 0–0)

## 2016-12-15 LAB — COMPREHENSIVE METABOLIC PANEL
ALBUMIN: 3.9 g/dL (ref 3.5–5.0)
ALK PHOS: 86 U/L (ref 40–150)
ALT: 8 U/L (ref 0–55)
ANION GAP: 8 meq/L (ref 3–11)
AST: 16 U/L (ref 5–34)
BUN: 14.1 mg/dL (ref 7.0–26.0)
CALCIUM: 9.3 mg/dL (ref 8.4–10.4)
CHLORIDE: 107 meq/L (ref 98–109)
CO2: 26 mEq/L (ref 22–29)
CREATININE: 0.8 mg/dL (ref 0.6–1.1)
EGFR: >60 mL/min/{1.73_m2} (ref >60)
Glucose: 83 mg/dl (ref 70–140)
POTASSIUM: 3.9 meq/L (ref 3.5–5.1)
Sodium: 141 mEq/L (ref 136–145)
Total Bilirubin: 0.82 mg/dL (ref 0.20–1.20)
Total Protein: 7.1 g/dL (ref 6.4–8.3)

## 2016-12-15 LAB — MAGNESIUM: MAGNESIUM: 1.9 mg/dL (ref 1.5–2.5)

## 2017-01-16 ENCOUNTER — Telehealth: Payer: Self-pay | Admitting: *Deleted

## 2017-01-16 ENCOUNTER — Telehealth: Payer: Self-pay | Admitting: Hematology

## 2017-01-16 NOTE — Telephone Encounter (Signed)
Received vm call from pt stating that she would like to come in to have her blood checked since she is feeling a little fatigue & also has some little spots on her legs.  Returned call & she wants to come in tomorrow.  Informed scheduling & they will call her.

## 2017-01-16 NOTE — Telephone Encounter (Signed)
Spoke to patient regarding upcoming appointments per 11/26 sch message

## 2017-01-17 ENCOUNTER — Other Ambulatory Visit (HOSPITAL_BASED_OUTPATIENT_CLINIC_OR_DEPARTMENT_OTHER): Payer: Medicaid Other

## 2017-01-17 DIAGNOSIS — C9201 Acute myeloblastic leukemia, in remission: Secondary | ICD-10-CM

## 2017-01-17 DIAGNOSIS — C9200 Acute myeloblastic leukemia, not having achieved remission: Secondary | ICD-10-CM

## 2017-01-17 LAB — CBC WITH DIFFERENTIAL/PLATELET
BASO%: 0.7 % (ref 0.0–2.0)
Basophils Absolute: 0 10*3/uL (ref 0.0–0.1)
EOS%: 0.5 % (ref 0.0–7.0)
Eosinophils Absolute: 0 10*3/uL (ref 0.0–0.5)
HCT: 34.5 % — ABNORMAL LOW (ref 34.8–46.6)
HGB: 11.6 g/dL (ref 11.6–15.9)
LYMPH%: 61.8 % — AB (ref 14.0–49.7)
MCH: 30.1 pg (ref 25.1–34.0)
MCHC: 33.4 g/dL (ref 31.5–36.0)
MCV: 90 fL (ref 79.5–101.0)
MONO#: 0.1 10*3/uL (ref 0.1–0.9)
MONO%: 4.5 % (ref 0.0–14.0)
NEUT%: 32.5 % — AB (ref 38.4–76.8)
NEUTROS ABS: 0.6 10*3/uL — AB (ref 1.5–6.5)
PLATELETS: 51 10*3/uL — AB (ref 145–400)
RBC: 3.84 10*6/uL (ref 3.70–5.45)
RDW: 16.3 % — ABNORMAL HIGH (ref 11.2–14.5)
WBC: 1.7 10*3/uL — AB (ref 3.9–10.3)
lymph#: 1.1 10*3/uL (ref 0.9–3.3)

## 2017-01-17 LAB — COMPREHENSIVE METABOLIC PANEL
ALBUMIN: 4.1 g/dL (ref 3.5–5.0)
ALK PHOS: 86 U/L (ref 40–150)
ALT: 13 U/L (ref 0–55)
ANION GAP: 8 meq/L (ref 3–11)
AST: 20 U/L (ref 5–34)
BILIRUBIN TOTAL: 0.52 mg/dL (ref 0.20–1.20)
BUN: 18 mg/dL (ref 7.0–26.0)
CALCIUM: 9.6 mg/dL (ref 8.4–10.4)
CO2: 24 mEq/L (ref 22–29)
CREATININE: 0.9 mg/dL (ref 0.6–1.1)
Chloride: 109 mEq/L (ref 98–109)
EGFR: >60 mL/min/{1.73_m2} (ref >60)
Glucose: 88 mg/dl (ref 70–140)
Potassium: 4 mEq/L (ref 3.5–5.1)
Sodium: 141 mEq/L (ref 136–145)
TOTAL PROTEIN: 7.4 g/dL (ref 6.4–8.3)

## 2017-01-17 LAB — MAGNESIUM: MAGNESIUM: 2.2 mg/dL (ref 1.5–2.5)

## 2017-01-18 ENCOUNTER — Telehealth: Payer: Self-pay | Admitting: *Deleted

## 2017-01-18 ENCOUNTER — Telehealth: Payer: Self-pay | Admitting: Hematology

## 2017-01-18 ENCOUNTER — Telehealth: Payer: Self-pay

## 2017-01-18 ENCOUNTER — Ambulatory Visit (HOSPITAL_BASED_OUTPATIENT_CLINIC_OR_DEPARTMENT_OTHER): Payer: Medicaid Other | Admitting: Hematology

## 2017-01-18 VITALS — BP 116/77 | HR 64 | Temp 97.0°F | Resp 18 | Ht 59.0 in | Wt 114.8 lb

## 2017-01-18 DIAGNOSIS — C9100 Acute lymphoblastic leukemia not having achieved remission: Secondary | ICD-10-CM

## 2017-01-18 DIAGNOSIS — C9200 Acute myeloblastic leukemia, not having achieved remission: Secondary | ICD-10-CM

## 2017-01-18 NOTE — Telephone Encounter (Signed)
Spoke to patient regarding upcoming November appointments per 11/27 sch message.

## 2017-01-18 NOTE — Telephone Encounter (Signed)
Called & left message with RN that pt preferred to get BMBX done in [facility] & req that Dr Florene Glen be notified of this & to see if he has any special request/preference.  Left Dr Ernestina Penna cell # for call back.

## 2017-01-18 NOTE — Progress Notes (Addendum)
[place]  [phone] [phone]) [phone]  Clinic Follow Up Note   Patient Care Team: Tawny Asal as PCP - General (Physician Assistant) 01/18/2017   CHIEF COMPLAINTS:  Left leg lesion   HISTORY OF PRESENTING ILLNESS:  Morgan Waters 51 y.o. female who presented to the ED on 04/29/16 for fatigue and diffuse intermittent bruising to her bilateral legs and right forearm that started 1 month prior. CBC revealed Hb 7.0, low WBC, RBC, HCT, and elevated RDW. Platelets were low at 54K. Blood smear revealed pancytopenia including neutropenia. 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 I was subsequently contacted and I requested 1 unit of blood, iron work up, and a follow up.  The patient has a history of anemia stretching back to February 2010, the oldest lab results provided. She had an Hb 11.9, Pl 268K, and ANC 12.9 on 04/20/2008. Hb 10.5 on 09/25/2015.  In regards to a family history of anemia ,she reports her daughter had anemia years ago and had to go to [place]. She does not know/remember what it was. Her daughter is a young adult now.  At this time, she reports occasional fatigue. She takes naps when she does have fatigue. She reports occasional night sweats. She had a partial hysterectomy years ago and did not have a menstrual cycle since. Denies fever or pruritus. She reports ecchymoses on her bilateral lower extremities with none on her arms. She denies recent chest pain on exertion, shortness of breath on minimal exertion, pre-syncopal episodes, or palpitations. She had not noticed any recent bleeding such as epistaxis, hematuria or hematochezia. The patient only takes over the counter NSAIDs for muscle pain PRN. She never had a colonoscopy. She had no prior history or diagnosis of cancer. She denies any pica and eats a variety of diet. She never donated blood. She received a blood transfusion on 04/30/16. The patient is  not prescribed any oral iron supplements.   Current Therapy:  The patient has completed consolidation using Hidac (3gm/m2) IV q12h on days 1, 2 and 3 for 6 doses.  for 3 cycles in 10/2016. Current on surveillance    INTERVAL HISTORY:  CHANNING SAVICH came in for routine lab test yesterday, no need blood transfusion.  He reports a lesion in the left leg which she is concerned about.  She came in today for follow-up.  She denies any significant pain, fever, or other bleedings.  She has completed consolidation chemotherapy, currently on surveillance.   MEDICAL HISTORY:  Past Medical History:  Diagnosis Date  . Leukemia ([place])   . Migraine     SURGICAL HISTORY: Past Surgical History:  Procedure Laterality Date  . ABDOMINAL HYSTERECTOMY    . CESAREAN SECTION      SOCIAL HISTORY: Social History   Socioeconomic History  . Marital status: Divorced    Spouse name: Not on file  . Number of children: Not on file  . Years of education: Not on file  . Highest education level: Not on file  Social Needs  . Financial resource strain: Not on file  . Food insecurity - worry: Not on file  . Food insecurity - inability: Not on file  . Transportation needs - medical: Not on file  . Transportation needs - non-medical: Not on file  Occupational History  . Not on file  Tobacco Use  . Smoking status: Never Smoker  . Smokeless tobacco: Never Used  Substance and Sexual Activity  . Alcohol use: No  .  Drug use: No  . Sexual activity: Not on file  Other Topics Concern  . Not on file  Social History Narrative  . Not on file    FAMILY HISTORY: Family History  Problem Relation Age of Onset  . Stroke Father   . Anemia Daughter     ALLERGIES:  has No Known Allergies.  MEDICATIONS:  Current Outpatient Medications  Medication Sig Dispense Refill  . acyclovir (ZOVIRAX) 400 MG tablet Take 400 mg by mouth daily.  5  . ibuprofen (ADVIL,MOTRIN) 200 MG tablet Take 200 mg by mouth every 6  (six) hours as needed for headache, mild pain or moderate pain.     Marland Kitchen loperamide (IMODIUM A-D) 2 MG tablet Take 1 tablet (2 mg total) by mouth 4 (four) times daily as needed for diarrhea or loose stools. 30 tablet 0  . polyvinyl alcohol (LIQUIFILM TEARS) 1.4 % ophthalmic solution Place 2 drops into both eyes every 4 (four) hours.    . potassium chloride SA (K-DUR,KLOR-CON) 20 MEQ tablet Take 1 tablet (20 mEq total) by mouth as directed. Take 40 meq daily for 4 days, then take 20 meq daily until further instructions. 30 tablet 1  . prednisoLONE acetate (PRED FORTE) 1 % ophthalmic suspension Place 2 drops into both eyes every 4 (four) hours.    . promethazine (PHENERGAN) 25 MG tablet Take 50 mg by mouth daily.    Marland Kitchen topiramate (TOPAMAX) 25 MG tablet Take 1 tablet (25 mg total) by mouth daily. 30 tablet 2  . nystatin (MYCOSTATIN) 100000 UNIT/ML suspension Take 5 mLs (500,000 Units total) by mouth 4 (four) times daily. (Patient not taking: Reported on 12/07/2016) 500 mL 0   No current facility-administered medications for this visit.    Facility-Administered Medications Ordered in Other Visits  Medication Dose Route Frequency Provider Last Rate Last Dose  . 0.9 %  sodium chloride infusion  250 mL Intravenous Once Truitt Merle, MD      . 0.9 %  sodium chloride infusion  250 mL Intravenous Once Truitt Merle, MD      . acetaminophen (TYLENOL) tablet 650 mg  650 mg Oral Once Truitt Merle, MD      . diphenhydrAMINE (BENADRYL) capsule 25 mg  25 mg Oral Once Truitt Merle, MD      . heparin lock flush 100 unit/mL  500 Units Intracatheter Daily PRN Truitt Merle, MD      . heparin lock flush 100 unit/mL  250 Units Intracatheter PRN Truitt Merle, MD      . sodium chloride flush (NS) 0.9 % injection 10 mL  10 mL Intracatheter PRN Truitt Merle, MD      . sodium chloride flush (NS) 0.9 % injection 3 mL  3 mL Intracatheter PRN Truitt Merle, MD        REVIEW OF SYSTEMS:   Constitutional: Denies fevers, chills or abnormal night sweats,  (+) fatigue  Eyes: Denies blurriness of vision, double vision or watery eyes (+) eye redness and irritation Ears, nose, mouth, throat, and face: Denies mucositis or sore throat Respiratory: Denies cough, dyspnea or wheezes Cardiovascular: Denies palpitation, chest discomfort or lower extremity swelling Gastrointestinal:  Denies nausea, heartburn or change in bowel habits Skin: Denies abnormal skin rashes  Lymphatics: Denies new lymphadenopathy or easy bruising Neurological:Denies numbness, tingling or new weaknesses Behavioral/Psych: Mood is stable, no new changes  All other systems were reviewed with the patient and are negative.  PHYSICAL EXAMINATION: ECOG PERFORMANCE STATUS: 1  Vitals:   01/18/17  1306  BP: 116/77  Pulse: 64  Resp: 18  Temp: (!) 97 F (36.1 C)  SpO2: 99%   Filed Weights   01/18/17 1306  Weight: 114 lb 12.8 oz (52.1 kg)   GENERAL:alert, no distress and comfortable, appears to be pale  SKIN: skin color, texture, turgor are normal, no rashes or significant lesions, except (+) a small Ecchymoses on left lateral leg, resolving, no open wound or palpable lesion. EYES: normal, conjunctiva are pink and non-injected, sclera clear  OROPHARYNX:no exudate, no erythema and lips, buccal mucosa, and tongue normal NECK: supple, thyroid normal size, non-tender, without nodularity LYMPH:  no palpable lymphadenopathy in the cervical, axillary or inguinal LUNGS: clear to auscultation and percussion with normal breathing effort HEART: regular rate & rhythm and no murmurs and no lower extremity edema ABDOMEN:abdomen soft, non-tender and normal bowel sounds Musculoskeletal:no cyanosis of digits and no clubbing. PSYCH: alert & oriented x 3 with fluent speech NEURO: no focal motor/sensory deficits  LABORATORY DATA:  I have reviewed the data as listed CBC Latest Ref Rng & Units 01/17/2017 12/15/2016 12/12/2016  WBC 3.9 - 10.3 10e3/uL 1.7(L) 2.7(L) 3.4(L)  Hemoglobin 11.6 - 15.9  g/dL 11.6 12.2 13.0  Hematocrit 34.8 - 46.6 % 34.5(L) 35.9 39.0  Platelets 145 - 400 10e3/uL 51(L) 109(L) 138(L)    CMP Latest Ref Rng & Units 01/17/2017 12/15/2016 12/12/2016  Glucose 70 - 140 mg/dl 88 83 106  BUN 7.0 - 26.0 mg/dL 18.0 14.1 17.1  Creatinine 0.6 - 1.1 mg/dL 0.9 0.8 0.9  Sodium 136 - 145 mEq/L 141 141 141  Potassium 3.5 - 5.1 mEq/L 4.0 3.9 4.1  Chloride 101 - 111 mmol/L - - -  CO2 22 - 29 mEq/L 24 26 24   Calcium 8.4 - 10.4 mg/dL 9.6 9.3 9.8  Total Protein 6.4 - 8.3 g/dL 7.4 7.1 7.5  Total Bilirubin 0.20 - 1.20 mg/dL 0.52 0.82 1.18  Alkaline Phos 40 - 150 U/L 86 86 86  AST 5 - 34 U/L 20 16 19   ALT 0 - 55 U/L 13 8 13     PATHOLOGY Diagnosis 05/10/16 Bone Marrow, Aspirate,Biopsy, and Clot, right iliac - ACUTE MYELOID LEUKEMIA. - SEE COMMENT. PERIPHERAL BLOOD: - PANCYTOPENIA.  Interpretation 05/10/16 Bone Marrow Flow Cytometry - ACUTE MYELOID LEUKEMIA, SEE COMMENT.  PERIPHERAL BLOOD: - PANCYTOPENIA. Diagnosis Note The marrow is mildly hypercellular with increased blasts (25% aspirate, 37% flow cytometry, 30% CD34). There is background dysplasia. Overall, the findings are consistent with acute myeloid leukemia. Dr. Burr Medico was notified on 05/11/2016.  RADIOGRAPHIC STUDIES: I have personally reviewed the radiological images as listed and agreed with the findings in the report. No results found.    ASSESSMENT & PLAN: 51 y.o. African-American female who presented to the ED for fatigue and upper and lower extremity ecchymoses  1. Acute myeloid Leukemia -She will diagnosed in March 2018, underwent chemo induction, and consolidation with high tach for 3 cycles, completed in September 2018.  She received her treatment at the [facility], and follow-up with Dr. Florene Glen  -She has developed new onset pancytopenia, I spoke with Dr. Prince Rome, who recommended a bone marrow biopsy to rule out relapse -I encourage patient to have a bone marrow biopsy at the [facility] -I encouraged  patient to consider bone marrow transplant, she seems to be open to transplant now, although she previously declined.  2. Pancytopenia -Secondary to recent chemo, but got worse again, suspicious for recurrence. -She received a Neulasta earlier this week. -Consider platelet transfusion if less  than 20 K, blood transfusion if hemoglobin less than 9.0, with leuko-reduced and irradiated blood products. -No need for blood products this week  3. Left leg ecchymosis -Secondary to thrombus cytopenia, no other signs of bleeding   PLAN -Labs reviewed, no need for blood transfusion -I encouraged patient to have a bone marrow biopsy at the [facility] as soon as possible -She will follow-up with Dr. Florene Glen at the [facility] for treatment and monitoring -I spoke with Dr. Florene Glen after her visit  -I will see her as needed in the future  All questions were answered. The patient knows to call the clinic with any problems, questions or concerns.  I spent 15 minutes counseling the patient face to face. The total time spent in the appointment was 20 minutes and more than 50% was on counseling.     Truitt Merle, MD 01/18/2017

## 2017-01-18 NOTE — Telephone Encounter (Signed)
Printed avs and cslender for upcoming appointment. Per 11/28 los

## 2017-01-19 ENCOUNTER — Telehealth: Payer: Self-pay | Admitting: *Deleted

## 2017-01-19 NOTE — Telephone Encounter (Signed)
Called [facility]- Requested Dr Florene Glen to call Dr Burr Medico to discuss patient

## 2017-01-19 NOTE — Telephone Encounter (Signed)
Ms Ghazarian called to see if Dr Burr Medico has heard anything from Dr Florene Glen about her having BMBX in [place].

## 2017-01-19 NOTE — Telephone Encounter (Signed)
Morgan Waters called Dr. Abel Presto office at [facility] yesterday, but I have not heard back from them. Could you ask them if they want [place] to do pt's bone marrow biopsy here as pt requested, please give Dr. Florene Glen my cell number if he wants to call me. Thanks    Truitt Merle MD

## 2017-01-20 ENCOUNTER — Telehealth: Payer: Self-pay | Admitting: *Deleted

## 2017-01-20 ENCOUNTER — Encounter: Payer: Self-pay | Admitting: Hematology

## 2017-01-20 NOTE — Telephone Encounter (Signed)
Pt called & left message that she was returning a call.  Discussed with Dr Burr Medico & she states that pt needs to go to [facility] for BMBX per Dr Florene Glen since there are test that we don't do here.  She was upset that it couldn't be done at [facility] but seemed to understand that she needs to go to [facility].  She asked that we call [facility] & have them call her.  Called & spoke with [facility] @ [facility] & she will relay info to Dr Abel Presto office.

## 2017-01-25 ENCOUNTER — Telehealth: Payer: Self-pay | Admitting: *Deleted

## 2017-01-25 ENCOUNTER — Other Ambulatory Visit (HOSPITAL_BASED_OUTPATIENT_CLINIC_OR_DEPARTMENT_OTHER): Payer: Medicaid Other

## 2017-01-25 DIAGNOSIS — C9100 Acute lymphoblastic leukemia not having achieved remission: Secondary | ICD-10-CM

## 2017-01-25 DIAGNOSIS — C9200 Acute myeloblastic leukemia, not having achieved remission: Secondary | ICD-10-CM

## 2017-01-25 LAB — COMPREHENSIVE METABOLIC PANEL
ALT: 13 U/L (ref 0–55)
AST: 20 U/L (ref 5–34)
Albumin: 4 g/dL (ref 3.5–5.0)
Alkaline Phosphatase: 85 U/L (ref 40–150)
Anion Gap: 8 mEq/L (ref 3–11)
BILIRUBIN TOTAL: 0.67 mg/dL (ref 0.20–1.20)
BUN: 13.7 mg/dL (ref 7.0–26.0)
CO2: 23 meq/L (ref 22–29)
CREATININE: 0.8 mg/dL (ref 0.6–1.1)
Calcium: 9.4 mg/dL (ref 8.4–10.4)
Chloride: 111 mEq/L — ABNORMAL HIGH (ref 98–109)
EGFR: >60 mL/min/{1.73_m2} (ref >60)
GLUCOSE: 80 mg/dL (ref 70–140)
Potassium: 3.9 mEq/L (ref 3.5–5.1)
SODIUM: 141 meq/L (ref 136–145)
TOTAL PROTEIN: 7.2 g/dL (ref 6.4–8.3)

## 2017-01-25 LAB — CBC WITH DIFFERENTIAL/PLATELET
BASO%: 0.6 % (ref 0.0–2.0)
Basophils Absolute: 0 10*3/uL (ref 0.0–0.1)
EOS ABS: 0 10*3/uL (ref 0.0–0.5)
EOS%: 0.6 % (ref 0.0–7.0)
HCT: 31.1 % — ABNORMAL LOW (ref 34.8–46.6)
HGB: 10.8 g/dL — ABNORMAL LOW (ref 11.6–15.9)
LYMPH%: 73.7 % — ABNORMAL HIGH (ref 14.0–49.7)
MCH: 30.8 pg (ref 25.1–34.0)
MCHC: 34.7 g/dL (ref 31.5–36.0)
MCV: 88.6 fL (ref 79.5–101.0)
MONO#: 0 10*3/uL — ABNORMAL LOW (ref 0.1–0.9)
MONO%: 2.4 % (ref 0.0–14.0)
NEUT%: 22.7 % — ABNORMAL LOW (ref 38.4–76.8)
NEUTROS ABS: 0.4 10*3/uL — AB (ref 1.5–6.5)
NRBC: 0 % (ref 0–0)
PLATELETS: 27 10*3/uL — AB (ref 145–400)
RBC: 3.51 10*6/uL — AB (ref 3.70–5.45)
RDW: 15.3 % — AB (ref 11.2–14.5)
WBC: 1.7 10*3/uL — AB (ref 3.9–10.3)
lymph#: 1.2 10*3/uL (ref 0.9–3.3)

## 2017-01-25 LAB — MAGNESIUM: Magnesium: 1.9 mg/dl (ref 1.5–2.5)

## 2017-01-25 NOTE — Telephone Encounter (Signed)
Received labs on pt.  Called pt & & she states no bleeding/bruising.  She had her BMBX yest.  Informed that labs will be sent to [facility] & if she is not seeing them soon will probably need to come here Monday for labs.  Pt expressed understanding.  Labs faxed & called to [facility].

## 2017-01-27 ENCOUNTER — Telehealth: Payer: Self-pay | Admitting: Hematology

## 2017-01-27 NOTE — Telephone Encounter (Signed)
Scheduled appts per 12/6 sch msg - spoke with patient and confirmed appts for Monday.

## 2017-01-29 ENCOUNTER — Telehealth: Payer: Self-pay | Admitting: *Deleted

## 2017-01-29 NOTE — Telephone Encounter (Signed)
Attempted to call patient to let patient know that [place] will be closed tomorrow 01/30/2017 and that someone from the [facility] will call to reschedule appointment. Left voicemail with information.

## 2017-01-30 ENCOUNTER — Other Ambulatory Visit: Payer: Medicaid Other

## 2017-01-30 ENCOUNTER — Ambulatory Visit: Payer: Medicaid Other

## 2017-01-31 ENCOUNTER — Other Ambulatory Visit: Payer: Self-pay | Admitting: *Deleted

## 2017-01-31 DIAGNOSIS — C9202 Acute myeloblastic leukemia, in relapse: Secondary | ICD-10-CM

## 2017-02-01 ENCOUNTER — Other Ambulatory Visit: Payer: Medicaid Other

## 2017-02-02 ENCOUNTER — Ambulatory Visit (HOSPITAL_COMMUNITY)
Admission: RE | Admit: 2017-02-02 | Discharge: 2017-02-02 | Disposition: A | Discharge status: Home / Self Care | Payer: Medicaid Other | Source: Ambulatory Visit | Attending: Hematology | Admitting: Hematology

## 2017-02-02 ENCOUNTER — Other Ambulatory Visit (HOSPITAL_BASED_OUTPATIENT_CLINIC_OR_DEPARTMENT_OTHER): Payer: Medicaid Other

## 2017-02-02 ENCOUNTER — Other Ambulatory Visit (INDEPENDENT_AMBULATORY_CARE_PROVIDER_SITE_OTHER): Payer: Self-pay | Admitting: Physician Assistant

## 2017-02-02 ENCOUNTER — Ambulatory Visit (HOSPITAL_BASED_OUTPATIENT_CLINIC_OR_DEPARTMENT_OTHER): Payer: Medicaid Other

## 2017-02-02 ENCOUNTER — Other Ambulatory Visit: Payer: Self-pay | Admitting: *Deleted

## 2017-02-02 DIAGNOSIS — C9202 Acute myeloblastic leukemia, in relapse: Secondary | ICD-10-CM

## 2017-02-02 DIAGNOSIS — C9100 Acute lymphoblastic leukemia not having achieved remission: Secondary | ICD-10-CM | POA: Diagnosis not present

## 2017-02-02 DIAGNOSIS — R197 Diarrhea, unspecified: Secondary | ICD-10-CM

## 2017-02-02 DIAGNOSIS — C9200 Acute myeloblastic leukemia, not having achieved remission: Secondary | ICD-10-CM | POA: Diagnosis not present

## 2017-02-02 LAB — COMPREHENSIVE METABOLIC PANEL
ALBUMIN: 4 g/dL (ref 3.5–5.0)
ALT: 13 U/L (ref 0–55)
AST: 20 U/L (ref 5–34)
Alkaline Phosphatase: 95 U/L (ref 40–150)
Anion Gap: 9 mEq/L (ref 3–11)
BUN: 21.8 mg/dL (ref 7.0–26.0)
CO2: 22 mEq/L (ref 22–29)
Calcium: 9.3 mg/dL (ref 8.4–10.4)
Chloride: 109 mEq/L (ref 98–109)
Creatinine: 0.8 mg/dL (ref 0.6–1.1)
EGFR: >60 mL/min/{1.73_m2} (ref >60)
GLUCOSE: 84 mg/dL (ref 70–140)
POTASSIUM: 4.1 meq/L (ref 3.5–5.1)
SODIUM: 140 meq/L (ref 136–145)
Total Bilirubin: 0.59 mg/dL (ref 0.20–1.20)
Total Protein: 7.2 g/dL (ref 6.4–8.3)

## 2017-02-02 LAB — CBC WITH DIFFERENTIAL/PLATELET
BASO%: 0 % (ref 0.0–2.0)
Basophils Absolute: 0 10*3/uL (ref 0.0–0.1)
EOS ABS: 0 10*3/uL (ref 0.0–0.5)
EOS%: 0 % (ref 0.0–7.0)
HCT: 29.9 % — ABNORMAL LOW (ref 34.8–46.6)
HEMOGLOBIN: 10.4 g/dL — AB (ref 11.6–15.9)
LYMPH#: 1.2 10*3/uL (ref 0.9–3.3)
LYMPH%: 76.8 % — ABNORMAL HIGH (ref 14.0–49.7)
MCH: 30.7 pg (ref 25.1–34.0)
MCHC: 34.8 g/dL (ref 31.5–36.0)
MCV: 88.2 fL (ref 79.5–101.0)
MONO#: 0.1 10*3/uL (ref 0.1–0.9)
MONO%: 8.4 % (ref 0.0–14.0)
NEUT%: 14.8 % — ABNORMAL LOW (ref 38.4–76.8)
NEUTROS ABS: 0.2 10*3/uL — AB (ref 1.5–6.5)
PLATELETS: 18 10*3/uL — AB (ref 145–400)
RBC: 3.39 10*6/uL — ABNORMAL LOW (ref 3.70–5.45)
RDW: 15.1 % — AB (ref 11.2–14.5)
WBC: 1.6 10*3/uL — AB (ref 3.9–10.3)

## 2017-02-02 LAB — MAGNESIUM: Magnesium: 2.2 mg/dl (ref 1.5–2.5)

## 2017-02-02 LAB — URIC ACID: Uric Acid, Serum: 4.8 mg/dl (ref 2.6–7.4)

## 2017-02-02 LAB — LACTATE DEHYDROGENASE: LDH: 135 U/L (ref 125–245)

## 2017-02-02 MED ORDER — DIPHENHYDRAMINE HCL 25 MG PO CAPS
ORAL_CAPSULE | ORAL | Status: AC
Start: 2017-02-02 — End: 2017-02-02
  Filled 2017-02-02: qty 1

## 2017-02-02 MED ORDER — SODIUM CHLORIDE 0.9 % IV SOLN
250.0000 mL | Freq: Once | INTRAVENOUS | Status: AC
  Administered 2017-02-02: 250 mL via INTRAVENOUS

## 2017-02-02 MED ORDER — SODIUM CHLORIDE 0.9% FLUSH
10.0000 mL | Freq: Once | INTRAVENOUS | Status: AC
  Administered 2017-02-02: 10 mL
  Filled 2017-02-02: qty 10

## 2017-02-02 MED ORDER — HEPARIN SOD (PORK) LOCK FLUSH 100 UNIT/ML IV SOLN
500.0000 [IU] | Freq: Every day | INTRAVENOUS | Status: AC | PRN
  Administered 2017-02-02: 500 [IU]
  Filled 2017-02-02: qty 5

## 2017-02-02 MED ORDER — DIPHENHYDRAMINE HCL 25 MG PO CAPS
25.0000 mg | ORAL_CAPSULE | Freq: Once | ORAL | Status: AC
  Administered 2017-02-02: 25 mg via ORAL

## 2017-02-02 MED ORDER — ACETAMINOPHEN 325 MG PO TABS
650.0000 mg | ORAL_TABLET | Freq: Once | ORAL | Status: AC
  Administered 2017-02-02: 650 mg via ORAL

## 2017-02-02 MED ORDER — ACETAMINOPHEN 325 MG PO TABS
ORAL_TABLET | ORAL | Status: AC
  Filled 2017-02-02: qty 2

## 2017-02-02 NOTE — Telephone Encounter (Signed)
FWD to PCP. Morgan Waters S Marlies Ligman, CMA  

## 2017-02-02 NOTE — Patient Instructions (Signed)
Platelet Transfusion, Care After °Refer to this sheet in the next few weeks. These instructions provide you with information about caring for yourself after your procedure. Your health care provider may also give you more specific instructions. Your treatment has been planned according to current medical practices, but problems sometimes occur. Call your health care provider if you have any problems or questions after your procedure. °What can I expect after the procedure? °After the procedure, it is common to have: °· Bruising and soreness at the IV site. °· Fever or chills within the first 48 hours of your transfusion. ° °Follow these instructions at home: °· Take medicines only as directed by your health care provider. Ask your health care provider if you can take an over-the-counter pain reliever in case you have a fever or headache a day or two after your transfusion. °· Return to your normal activities as directed by your health care provider. °Contact a health care provider if: °· You have a fever. °· You have a headache. °· You have redness, swelling, or pain at your IV site. °· You have skin itching or a rash. °· You vomit. °· You feel unusually tired or weak. °Get help right away if: °· You have trouble breathing. °· You have a decreased amount of urine or you urinate less often than you normally do. °· Your urine is darker than normal. °· You have pain in your back, abdomen, or chest. °· You have cool, clammy skin. °· You have a rapid heartbeat. °This information is not intended to replace advice given to you by your health care provider. Make sure you discuss any questions you have with your health care provider. °Document Released: 02/28/2014 Document Revised: 07/16/2015 Document Reviewed: 12/18/2013 °Elsevier Interactive Patient Education © 2018 Elsevier Inc. ° °

## 2017-02-03 LAB — BPAM PLATELET PHERESIS
BLOOD PRODUCT EXPIRATION DATE: 201812132359
ISSUE DATE / TIME: 201812131531
UNIT TYPE AND RH: 5100

## 2017-02-03 LAB — PREPARE PLATELET PHERESIS: Unit division: 0

## 2017-02-06 ENCOUNTER — Telehealth: Payer: Self-pay | Admitting: Hematology

## 2017-02-06 ENCOUNTER — Encounter: Payer: Self-pay | Admitting: Hematology

## 2017-02-06 ENCOUNTER — Ambulatory Visit (HOSPITAL_BASED_OUTPATIENT_CLINIC_OR_DEPARTMENT_OTHER): Payer: Medicaid Other

## 2017-02-06 ENCOUNTER — Ambulatory Visit (INDEPENDENT_AMBULATORY_CARE_PROVIDER_SITE_OTHER): Payer: Medicaid Other | Admitting: Physician Assistant

## 2017-02-06 ENCOUNTER — Other Ambulatory Visit (HOSPITAL_BASED_OUTPATIENT_CLINIC_OR_DEPARTMENT_OTHER): Payer: Medicaid Other

## 2017-02-06 ENCOUNTER — Ambulatory Visit (HOSPITAL_BASED_OUTPATIENT_CLINIC_OR_DEPARTMENT_OTHER): Payer: Medicaid Other | Admitting: Hematology

## 2017-02-06 ENCOUNTER — Other Ambulatory Visit: Payer: Self-pay | Admitting: *Deleted

## 2017-02-06 DIAGNOSIS — R58 Hemorrhage, not elsewhere classified: Secondary | ICD-10-CM | POA: Diagnosis not present

## 2017-02-06 DIAGNOSIS — D61810 Antineoplastic chemotherapy induced pancytopenia: Secondary | ICD-10-CM | POA: Diagnosis not present

## 2017-02-06 DIAGNOSIS — C9202 Acute myeloblastic leukemia, in relapse: Secondary | ICD-10-CM

## 2017-02-06 DIAGNOSIS — C9200 Acute myeloblastic leukemia, not having achieved remission: Secondary | ICD-10-CM

## 2017-02-06 LAB — COMPREHENSIVE METABOLIC PANEL
ALT: 18 U/L (ref 0–55)
ANION GAP: 9 meq/L (ref 3–11)
AST: 19 U/L (ref 5–34)
Albumin: 4.2 g/dL (ref 3.5–5.0)
Alkaline Phosphatase: 100 U/L (ref 40–150)
BILIRUBIN TOTAL: 0.62 mg/dL (ref 0.20–1.20)
BUN: 17.1 mg/dL (ref 7.0–26.0)
CALCIUM: 9.9 mg/dL (ref 8.4–10.4)
CO2: 23 meq/L (ref 22–29)
CREATININE: 1 mg/dL (ref 0.6–1.1)
Chloride: 109 mEq/L (ref 98–109)
EGFR: >60 mL/min/{1.73_m2} (ref >60)
Glucose: 92 mg/dl (ref 70–140)
Potassium: 4.3 mEq/L (ref 3.5–5.1)
Sodium: 141 mEq/L (ref 136–145)
TOTAL PROTEIN: 7.8 g/dL (ref 6.4–8.3)

## 2017-02-06 LAB — CBC WITH DIFFERENTIAL/PLATELET
BASO%: 0 % (ref 0.0–2.0)
BASOS ABS: 0 10*3/uL (ref 0.0–0.1)
EOS%: 0 % (ref 0.0–7.0)
Eosinophils Absolute: 0 10*3/uL (ref 0.0–0.5)
HCT: 31.2 % — ABNORMAL LOW (ref 34.8–46.6)
HEMOGLOBIN: 10.9 g/dL — AB (ref 11.6–15.9)
LYMPH%: 79.2 % — ABNORMAL HIGH (ref 14.0–49.7)
MCH: 30.8 pg (ref 25.1–34.0)
MCHC: 34.9 g/dL (ref 31.5–36.0)
MCV: 88.1 fL (ref 79.5–101.0)
MONO#: 0.1 10*3/uL (ref 0.1–0.9)
MONO%: 6.7 % (ref 0.0–14.0)
NEUT#: 0.3 10*3/uL — CL (ref 1.5–6.5)
NEUT%: 14.1 % — AB (ref 38.4–76.8)
Platelets: 19 10*3/uL — ABNORMAL LOW (ref 145–400)
RBC: 3.54 10*6/uL — AB (ref 3.70–5.45)
RDW: 14.9 % — AB (ref 11.2–14.5)
WBC: 1.8 10*3/uL — ABNORMAL LOW (ref 3.9–10.3)
lymph#: 1.4 10*3/uL (ref 0.9–3.3)

## 2017-02-06 LAB — URIC ACID: Uric Acid, Serum: 5.4 mg/dl (ref 2.6–7.4)

## 2017-02-06 LAB — LACTATE DEHYDROGENASE: LDH: 141 U/L (ref 125–245)

## 2017-02-06 LAB — MAGNESIUM: MAGNESIUM: 2.1 mg/dL (ref 1.5–2.5)

## 2017-02-06 MED ORDER — DIPHENHYDRAMINE HCL 25 MG PO CAPS
25.0000 mg | ORAL_CAPSULE | Freq: Once | ORAL | Status: AC
  Administered 2017-02-06: 25 mg via ORAL

## 2017-02-06 MED ORDER — HEPARIN SOD (PORK) LOCK FLUSH 100 UNIT/ML IV SOLN
500.0000 [IU] | Freq: Every day | INTRAVENOUS | Status: AC | PRN
  Administered 2017-02-06: 500 [IU]
  Filled 2017-02-06: qty 5

## 2017-02-06 MED ORDER — ACETAMINOPHEN 325 MG PO TABS
650.0000 mg | ORAL_TABLET | Freq: Once | ORAL | Status: AC
  Administered 2017-02-06: 650 mg via ORAL

## 2017-02-06 MED ORDER — SODIUM CHLORIDE 0.9% FLUSH
10.0000 mL | INTRAVENOUS | Status: AC | PRN
  Administered 2017-02-06: 10 mL
  Filled 2017-02-06: qty 10

## 2017-02-06 MED ORDER — SODIUM CHLORIDE 0.9 % IV SOLN
250.0000 mL | Freq: Once | INTRAVENOUS | Status: AC
  Administered 2017-02-06: 250 mL via INTRAVENOUS

## 2017-02-06 NOTE — Patient Instructions (Signed)
Platelet Transfusion, Care After °Refer to this sheet in the next few weeks. These instructions provide you with information about caring for yourself after your procedure. Your health care provider may also give you more specific instructions. Your treatment has been planned according to current medical practices, but problems sometimes occur. Call your health care provider if you have any problems or questions after your procedure. °What can I expect after the procedure? °After the procedure, it is common to have: °· Bruising and soreness at the IV site. °· Fever or chills within the first 48 hours of your transfusion. ° °Follow these instructions at home: °· Take medicines only as directed by your health care provider. Ask your health care provider if you can take an over-the-counter pain reliever in case you have a fever or headache a day or two after your transfusion. °· Return to your normal activities as directed by your health care provider. °Contact a health care provider if: °· You have a fever. °· You have a headache. °· You have redness, swelling, or pain at your IV site. °· You have skin itching or a rash. °· You vomit. °· You feel unusually tired or weak. °Get help right away if: °· You have trouble breathing. °· You have a decreased amount of urine or you urinate less often than you normally do. °· Your urine is darker than normal. °· You have pain in your back, abdomen, or chest. °· You have cool, clammy skin. °· You have a rapid heartbeat. °This information is not intended to replace advice given to you by your health care provider. Make sure you discuss any questions you have with your health care provider. °Document Released: 02/28/2014 Document Revised: 07/16/2015 Document Reviewed: 12/18/2013 °Elsevier Interactive Patient Education © 2018 Elsevier Inc. ° °

## 2017-02-06 NOTE — Progress Notes (Signed)
[place]  [phone]) [phone] [phone]  Clinic Follow Up Note   Patient Care Team: Tawny Asal as PCP - General (Physician Assistant)   Date of Service:  02/06/2017   CHIEF COMPLAINTS:  Relapsed Acute myeloid leukemia   HISTORY OF PRESENTING ILLNESS:  Morgan Waters 51 y.o. female who presented to the ED on 04/29/16 for fatigue and diffuse intermittent bruising to her bilateral legs and right forearm that started 1 month prior. CBC revealed Hb 7.0, low WBC, RBC, HCT, and elevated RDW. Platelets were low at 54K. Blood smear revealed pancytopenia including neutropenia. 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 I was subsequently contacted and I requested 1 unit of blood, iron work up, and a follow up.  The patient has a history of anemia stretching back to February 2010, the oldest lab results provided. She had an Hb 11.9, Pl 268K, and ANC 12.9 on 04/20/2008. Hb 10.5 on 09/25/2015.  In regards to a family history of anemia ,she reports her daughter had anemia years ago and had to go to [place]. She does not know/remember what it was. Her daughter is a young adult now.  At this time, she reports occasional fatigue. She takes naps when she does have fatigue. She reports occasional night sweats. She had a partial hysterectomy years ago and did not have a menstrual cycle since. Denies fever or pruritus. She reports ecchymoses on her bilateral lower extremities with none on her arms. She denies recent chest pain on exertion, shortness of breath on minimal exertion, pre-syncopal episodes, or palpitations. She had not noticed any recent bleeding such as epistaxis, hematuria or hematochezia. The patient only takes over the counter NSAIDs for muscle pain PRN. She never had a colonoscopy. She had no prior history or diagnosis of cancer. She denies any pica and eats a variety of diet. She never donated blood. She received a blood  transfusion on 04/30/16. The patient is not prescribed any oral iron supplements.   Previous Therapy:  The patient has completed consolidation using Hidac (3gm/m2) IV q12h on days 1, 2 and 3 for 6 doses for 3 cycles in 10/2016.    Current Therapy:  Pending for induction chemo on 02/17/17, supportive care    INTERVAL HISTORY:  Morgan Waters is here for a follow up. She presents to the clinic today reporting she had bone marrow biopsy by Dr. Florene Glen at [facility] which showed relapse of AML. She plans to have re-induction chemo on 02/17/17. She denies any bleeding. Her Plt is 19K so she will get platelet transfusion today. She requested an insurance form to be filled out. She is currently on disability and is not working currently.     MEDICAL HISTORY:  Past Medical History:  Diagnosis Date  . Leukemia ([place])   . Migraine     SURGICAL HISTORY: Past Surgical History:  Procedure Laterality Date  . ABDOMINAL HYSTERECTOMY    . CESAREAN SECTION      SOCIAL HISTORY: Social History   Socioeconomic History  . Marital status: Divorced    Spouse name: Not on file  . Number of children: Not on file  . Years of education: Not on file  . Highest education level: Not on file  Social Needs  . Financial resource strain: Not on file  . Food insecurity - worry: Not on file  . Food insecurity - inability: Not on file  . Transportation needs - medical: Not on file  . Transportation needs -  non-medical: Not on file  Occupational History  . Not on file  Tobacco Use  . Smoking status: Never Smoker  . Smokeless tobacco: Never Used  Substance and Sexual Activity  . Alcohol use: No  . Drug use: No  . Sexual activity: Not on file  Other Topics Concern  . Not on file  Social History Narrative  . Not on file    FAMILY HISTORY: Family History  Problem Relation Age of Onset  . Stroke Father   . Anemia Daughter     ALLERGIES:  has No Known Allergies.  MEDICATIONS:  Current  Outpatient Medications  Medication Sig Dispense Refill  . acyclovir (ZOVIRAX) 400 MG tablet Take 400 mg by mouth daily.  5  . ibuprofen (ADVIL,MOTRIN) 200 MG tablet Take 200 mg by mouth every 6 (six) hours as needed for headache, mild pain or moderate pain.     Marland Kitchen loperamide (IMODIUM) 2 MG capsule take 1 capsule by mouth four times a day if needed for diarrhea or LOOSE STOOLS 30 capsule 0  . nystatin (MYCOSTATIN) 100000 UNIT/ML suspension Take 5 mLs (500,000 Units total) by mouth 4 (four) times daily. (Patient not taking: Reported on 12/07/2016) 500 mL 0  . polyvinyl alcohol (LIQUIFILM TEARS) 1.4 % ophthalmic solution Place 2 drops into both eyes every 4 (four) hours.    . potassium chloride SA (K-DUR,KLOR-CON) 20 MEQ tablet Take 1 tablet (20 mEq total) by mouth as directed. Take 40 meq daily for 4 days, then take 20 meq daily until further instructions. 30 tablet 1  . prednisoLONE acetate (PRED FORTE) 1 % ophthalmic suspension Place 2 drops into both eyes every 4 (four) hours.    . promethazine (PHENERGAN) 25 MG tablet Take 50 mg by mouth daily.    Marland Kitchen topiramate (TOPAMAX) 25 MG tablet Take 1 tablet (25 mg total) by mouth daily. 30 tablet 2   No current facility-administered medications for this visit.    Facility-Administered Medications Ordered in Other Visits  Medication Dose Route Frequency Provider Last Rate Last Dose  . 0.9 %  sodium chloride infusion  250 mL Intravenous Once Truitt Merle, MD      . 0.9 %  sodium chloride infusion  250 mL Intravenous Once Truitt Merle, MD      . acetaminophen (TYLENOL) tablet 650 mg  650 mg Oral Once Truitt Merle, MD      . diphenhydrAMINE (BENADRYL) capsule 25 mg  25 mg Oral Once Truitt Merle, MD      . heparin lock flush 100 unit/mL  500 Units Intracatheter Daily PRN Truitt Merle, MD      . heparin lock flush 100 unit/mL  250 Units Intracatheter PRN Truitt Merle, MD      . sodium chloride flush (NS) 0.9 % injection 10 mL  10 mL Intracatheter PRN Truitt Merle, MD      .  sodium chloride flush (NS) 0.9 % injection 3 mL  3 mL Intracatheter PRN Truitt Merle, MD        REVIEW OF SYSTEMS:   Constitutional: Denies fevers, chills or abnormal night sweats Eyes: Denies blurriness of vision, double vision or watery eyes  Ears, nose, mouth, throat, and face: Denies mucositis or sore throat Respiratory: Denies cough, dyspnea or wheezes Cardiovascular: Denies palpitation, chest discomfort or lower extremity swelling Gastrointestinal:  Denies nausea, heartburn or change in bowel habits Skin: Denies abnormal skin rashes  Lymphatics: Denies new lymphadenopathy  Neurological:Denies numbness, tingling or new weaknesses Behavioral/Psych: Mood is stable, no  new changes  All other systems were reviewed with the patient and are negative.  PHYSICAL EXAMINATION: ECOG PERFORMANCE STATUS: 1  Vitals:   02/06/17 1406  BP: (!) 114/98  Pulse: 88  Resp: 18  Temp: 98.8 F (37.1 C)  SpO2: 100%   Filed Weights   02/06/17 1406  Weight: 112 lb 8 oz (51 kg)     GENERAL:alert, no distress and comfortable, appears to be pale  SKIN: skin color, texture, turgor are normal, no rashes or significant lesions  EYES: normal, conjunctiva are pink and non-injected, sclera clear  OROPHARYNX:no exudate, no erythema and lips, buccal mucosa, and tongue normal NECK: supple, thyroid normal size, non-tender, without nodularity LYMPH:  no palpable lymphadenopathy in the cervical, axillary or inguinal LUNGS: clear to auscultation and percussion with normal breathing effort HEART: regular rate & rhythm and no murmurs and no lower extremity edema ABDOMEN:abdomen soft, non-tender and normal bowel sounds Musculoskeletal:no cyanosis of digits and no clubbing. PSYCH: alert & oriented x 3 with fluent speech NEURO: no focal motor/sensory deficits  LABORATORY DATA:  I have reviewed the data as listed CBC Latest Ref Rng & Units 02/06/2017 02/02/2017 01/25/2017  WBC 3.9 - 10.3 10e3/uL 1.8(L) 1.6(L) 1.7(L)   Hemoglobin 11.6 - 15.9 g/dL 10.9(L) 10.4(L) 10.8(L)  Hematocrit 34.8 - 46.6 % 31.2(L) 29.9(L) 31.1(L)  Platelets 145 - 400 10e3/uL 19(L) 18(L) 27(L)    CMP Latest Ref Rng & Units 02/06/2017 02/02/2017 01/25/2017  Glucose 70 - 140 mg/dl 92 84 80  BUN 7.0 - 26.0 mg/dL 17.1 21.8 13.7  Creatinine 0.6 - 1.1 mg/dL 1.0 0.8 0.8  Sodium 136 - 145 mEq/L 141 140 141  Potassium 3.5 - 5.1 mEq/L 4.3 4.1 3.9  Chloride 101 - 111 mmol/L - - -  CO2 22 - 29 mEq/L _0 Calcium 8.4 - 10.4 mg/dL 9.9 9.3 9.4  Total Protein 6.4 - 8.3 g/dL 7.8 7.2 7.2  Total Bilirubin 0.20 - 1.20 mg/dL 0.62 0.59 0.67  Alkaline Phos 40 - 150 U/L 100 95 85  AST 5 - 34 U/L _1 ALT 0 - 55 U/L _2 PATHOLOGY  Bone Marrow (BM) and Peripheral Blood (PB) at [facility] 01/26/17 FINAL PATHOLOGIC DIAGNOSIS BONE MARROW: Relapsed acute myeloid leukemia involving a hypercellular marrow (60%) with increased blasts (30% by CD34). See comment. PERIPHERAL BLOOD: Pancytopenia. See CBC data. COMMENT: Flow cytometry demonstrates a population of myeloblasts (7%) which are positive for CD13, CD33(dim), CD34, CD38 and HLA-DR(dim).   Diagnosis 05/10/16 Bone Marrow, Aspirate,Biopsy, and Clot, right iliac - ACUTE MYELOID LEUKEMIA. - SEE COMMENT. PERIPHERAL BLOOD: - PANCYTOPENIA.  Interpretation 05/10/16 Bone Marrow Flow Cytometry - ACUTE MYELOID LEUKEMIA, SEE COMMENT.  PERIPHERAL BLOOD: - PANCYTOPENIA. Diagnosis Note The marrow is mildly hypercellular with increased blasts (25% aspirate, 37% flow cytometry, 30% CD34). There is background dysplasia. Overall, the findings are consistent with acute myeloid leukemia. Dr. Burr Medico was notified on 05/11/2016.  RADIOGRAPHIC STUDIES: I have personally reviewed the radiological images as listed and agreed with the findings in the report. No results found.    ASSESSMENT & PLAN: 51 y.o. African-American female who presented to the ED for  fatigue and upper and lower extremity ecchymoses  1. Acute myeloid leukemia, relapsed  -She will diagnosed in March 2018, underwent chemo induction, and consolidation with high tach for 3 cycles, completed in September 2018.  She received her treatment at the [facility], and follow-up with Dr. Florene Glen  -She  has developed new onset pancytopenia, she had bone marrow biopsy at [facility] on 01/26/17 which showed relapse of her AML -She plans to return to [facility] on 02/17/17 for re-induction chemo, I encouraged her to consider pulmonary transplant if she gets into remission again. -Labs reviewed, hg is 10.9, her ANC is 0.3, plt is at 19K. She will get a platelet transfusion today. I discussed her risk of infection due to her being very neutropenic. She knows to stay away from crowds and those who are sick.  -F/u open    2. Pancytopenia  -Secondary to recent chemo, but got worse again, suspicious for recurrence. -She received a Neulasta previously to treat her neutropenia.  -Consider platelet transfusion if less than 20 K, blood transfusion if hemoglobin less than 9.0, with leuko-reduced and irradiated blood products. -No need for blood products previously  -Her hg is 10.9, her ANC is 0.3, plt is at 19K today (02/06/17). Will set up a platelet transfusion to be done this week, no need for  blood transfusion   3. Left leg ecchymosis -Secondary to thrombus cytopenia, no other signs of bleeding -mild    PLAN -filled out insurance form for patient  -Labs reviewed, plt at 19K and will have platelet transfusion today, irradiated -Lab and 1hr blood transfusion this 12/20 or 12/21 and 12/24  -re-induction chemo 12/28 at [facility]  -F/u open    All questions were answered. The patient knows to call the clinic with any problems, questions or concerns.  I spent 10 minutes counseling the patient face to face. The total time spent in the appointment was 15 minutes and more than 50% was on  counseling.     Truitt Merle, MD 02/06/2017   This document serves as a record of services personally performed by Truitt Merle, MD. It was created on her behalf by Joslyn Devon, a trained medical scribe. The creation of this record is based on the scribe's personal observations and the provider's statements to them.    I have reviewed the above documentation for accuracy and completeness, and I agree with the above.

## 2017-02-06 NOTE — Telephone Encounter (Signed)
Gave avs and calendar for December  °

## 2017-02-07 ENCOUNTER — Ambulatory Visit (INDEPENDENT_AMBULATORY_CARE_PROVIDER_SITE_OTHER): Payer: Medicaid Other | Admitting: Physician Assistant

## 2017-02-07 ENCOUNTER — Encounter (INDEPENDENT_AMBULATORY_CARE_PROVIDER_SITE_OTHER): Payer: Self-pay | Admitting: Physician Assistant

## 2017-02-07 ENCOUNTER — Other Ambulatory Visit: Payer: Self-pay

## 2017-02-07 VITALS — BP 110/75 | HR 77 | Temp 98.1°F | Wt 117.2 lb

## 2017-02-07 DIAGNOSIS — G43809 Other migraine, not intractable, without status migrainosus: Secondary | ICD-10-CM | POA: Diagnosis not present

## 2017-02-07 DIAGNOSIS — Z1211 Encounter for screening for malignant neoplasm of colon: Secondary | ICD-10-CM

## 2017-02-07 LAB — PREPARE PLATELET PHERESIS: UNIT DIVISION: 0

## 2017-02-07 LAB — BPAM PLATELET PHERESIS
BLOOD PRODUCT EXPIRATION DATE: 201812182359
ISSUE DATE / TIME: 201812171547
UNIT TYPE AND RH: 5100

## 2017-02-07 MED ORDER — SUMATRIPTAN SUCCINATE 25 MG PO TABS: 25.0000 mg | ORAL_TABLET | Freq: Every day | ORAL | 2 refills | Status: AC

## 2017-02-07 MED ORDER — AMITRIPTYLINE HCL 25 MG PO TABS: 25.0000 mg | ORAL_TABLET | Freq: Every day | ORAL | 2 refills | Status: AC

## 2017-02-07 NOTE — Progress Notes (Signed)
Patient states that Topamax is not helping

## 2017-02-07 NOTE — Progress Notes (Signed)
Subjective:  Patient ID: Morgan Waters, female    DOB: [birth year]  Age: 51 y.o. MRN: [id number]  CC: f/u headache  HPI  Morgan Waters is a 51 y.o. female with a medical history of Acute Myeloid leukemia and migraines presents to f/u on treatment for headache. Last seen 12/07/16 with a 5 day hx of headache. Described as left sided throbbing pain, sharp, and associated with visual blurring. Relieved with rest in a dark room. Prescribed Topiramate 25 mg qday which proved to be ineffective. Headache is occurring 2 -3 times per week lasting a maximum of 24 hours but usually 4- 5 hours. Characteristics are the same as before. Has not tried anything else for pain. Does not endorse CP, palpitations, SOB, abdominal pain, f/c/n/v, rash, swelling, or GI/GU sxs.     Outpatient Medications Prior to Visit  Medication Sig Dispense Refill  . acyclovir (ZOVIRAX) 400 MG tablet Take 400 mg by mouth daily.  5  . ibuprofen (ADVIL,MOTRIN) 200 MG tablet Take 200 mg by mouth every 6 (six) hours as needed for headache, mild pain or moderate pain.     Marland Kitchen nystatin (MYCOSTATIN) 100000 UNIT/ML suspension Take 5 mLs (500,000 Units total) by mouth 4 (four) times daily. 500 mL 0  . promethazine (PHENERGAN) 25 MG tablet Take 50 mg by mouth daily.    Marland Kitchen topiramate (TOPAMAX) 25 MG tablet Take 1 tablet (25 mg total) by mouth daily. 30 tablet 2  . loperamide (IMODIUM) 2 MG capsule take 1 capsule by mouth four times a day if needed for diarrhea or LOOSE STOOLS (Patient not taking: Reported on 02/07/2017) 30 capsule 0  . polyvinyl alcohol (LIQUIFILM TEARS) 1.4 % ophthalmic solution Place 2 drops into both eyes every 4 (four) hours.    . potassium chloride SA (K-DUR,KLOR-CON) 20 MEQ tablet Take 1 tablet (20 mEq total) by mouth as directed. Take 40 meq daily for 4 days, then take 20 meq daily until further instructions. (Patient not taking: Reported on 02/07/2017) 30 tablet 1  . prednisoLONE acetate (PRED FORTE) 1 %  ophthalmic suspension Place 2 drops into both eyes every 4 (four) hours.     Facility-Administered Medications Prior to Visit  Medication Dose Route Frequency Provider Last Rate Last Dose  . 0.9 %  sodium chloride infusion  250 mL Intravenous Once Truitt Merle, MD      . 0.9 %  sodium chloride infusion  250 mL Intravenous Once Truitt Merle, MD      . acetaminophen (TYLENOL) tablet 650 mg  650 mg Oral Once Truitt Merle, MD      . diphenhydrAMINE (BENADRYL) capsule 25 mg  25 mg Oral Once Truitt Merle, MD      . heparin lock flush 100 unit/mL  500 Units Intracatheter Daily PRN Truitt Merle, MD      . heparin lock flush 100 unit/mL  250 Units Intracatheter PRN Truitt Merle, MD      . sodium chloride flush (NS) 0.9 % injection 10 mL  10 mL Intracatheter PRN Truitt Merle, MD      . sodium chloride flush (NS) 0.9 % injection 3 mL  3 mL Intracatheter PRN Truitt Merle, MD         ROS Review of Systems  Constitutional: Negative for chills, fever and malaise/fatigue.  Eyes: Negative for blurred vision.  Respiratory: Negative for shortness of breath.   Cardiovascular: Negative for chest pain and palpitations.  Gastrointestinal: Negative for abdominal pain and nausea.  Genitourinary: Negative for  dysuria and hematuria.  Musculoskeletal: Negative for joint pain and myalgias.  Skin: Negative for rash.  Neurological: Positive for headaches. Negative for tingling.  Psychiatric/Behavioral: Negative for depression. The patient is not nervous/anxious.     Objective:  BP 110/75 (BP Location: Left Arm, Patient Position: Sitting, Cuff Size: Normal)   Pulse 77   Temp 98.1 F (36.7 C) (Oral)   Wt 117 lb 3.2 oz (53.2 kg)   SpO2 99%   BMI 23.67 kg/m   BP/Weight 02/07/2017 02/06/2017 63/02/6008  Systolic BP 932 355 732  Diastolic BP 75 77 98  Wt. (Lbs) 117.2 - 112.5  BMI 23.67 - 22.72      Physical Exam  Constitutional: She is oriented to person, place, and time.  Well developed, well nourished, NAD, polite  HENT:   Head: Normocephalic and atraumatic.  Eyes: Conjunctivae are normal. Pupils are equal, round, and reactive to light. No scleral icterus.  Neck: Normal range of motion. Neck supple. No thyromegaly present.  Cardiovascular: Normal rate, regular rhythm and normal heart sounds.  Pulmonary/Chest: Effort normal and breath sounds normal. No respiratory distress.  Musculoskeletal: She exhibits no edema.  Neurological: She is alert and oriented to person, place, and time. No cranial nerve deficit. Coordination normal.  Skin: Skin is warm and dry. No rash noted. No erythema. No pallor.  Psychiatric: She has a normal mood and affect. Her behavior is normal. Thought content normal.  Vitals reviewed.    Assessment & Plan:    1. Other migraine without status migrainosus, not intractable - SUMAtriptan (IMITREX) 25 MG tablet; Take 1 tablet (25 mg total) by mouth daily. May take one more tablet two hours after the first. No more than two tablets per day.  Dispense: 30 tablet; Refill: 2 - amitriptyline (ELAVIL) 25 MG tablet; Take 1 tablet (25 mg total) by mouth at bedtime.  Dispense: 30 tablet; Refill: 2  2. Screening for colon cancer - Fecal occult blood, imunochemical   Meds ordered this encounter  Medications  . SUMAtriptan (IMITREX) 25 MG tablet    Sig: Take 1 tablet (25 mg total) by mouth daily. May take one more tablet two hours after the first. No more than two tablets per day.    Dispense:  30 tablet    Refill:  2    Order Specific Question:   Supervising Provider    Answer:   Tresa Garter [id number]  . amitriptyline (ELAVIL) 25 MG tablet    Sig: Take 1 tablet (25 mg total) by mouth at bedtime.    Dispense:  30 tablet    Refill:  2    Order Specific Question:   Supervising Provider    Answer:   Tresa Garter [id number]    Follow-up: Return in about 6 weeks (around 03/21/2017) for headache.   Clent Demark PA

## 2017-02-07 NOTE — Patient Instructions (Signed)

## 2017-02-10 ENCOUNTER — Other Ambulatory Visit (HOSPITAL_BASED_OUTPATIENT_CLINIC_OR_DEPARTMENT_OTHER): Payer: Medicaid Other

## 2017-02-10 ENCOUNTER — Telehealth: Payer: Self-pay | Admitting: Medical Oncology

## 2017-02-10 ENCOUNTER — Ambulatory Visit (HOSPITAL_COMMUNITY)
Admission: RE | Admit: 2017-02-10 | Discharge: 2017-02-10 | Disposition: A | Discharge status: Home / Self Care | Payer: Medicaid Other | Source: Ambulatory Visit | Attending: Hematology | Admitting: Hematology

## 2017-02-10 ENCOUNTER — Other Ambulatory Visit: Payer: Self-pay | Admitting: Medical Oncology

## 2017-02-10 DIAGNOSIS — C9202 Acute myeloblastic leukemia, in relapse: Secondary | ICD-10-CM

## 2017-02-10 DIAGNOSIS — D61818 Other pancytopenia: Secondary | ICD-10-CM

## 2017-02-10 DIAGNOSIS — C9200 Acute myeloblastic leukemia, not having achieved remission: Secondary | ICD-10-CM | POA: Diagnosis not present

## 2017-02-10 DIAGNOSIS — C9002 Multiple myeloma in relapse: Secondary | ICD-10-CM | POA: Diagnosis not present

## 2017-02-10 DIAGNOSIS — D61810 Antineoplastic chemotherapy induced pancytopenia: Secondary | ICD-10-CM

## 2017-02-10 LAB — CBC WITH DIFFERENTIAL/PLATELET
BASO%: 0 % (ref 0.0–2.0)
Basophils Absolute: 0 10*3/uL (ref 0.0–0.1)
EOS%: 0.8 % (ref 0.0–7.0)
Eosinophils Absolute: 0 10*3/uL (ref 0.0–0.5)
HCT: 26.1 % — ABNORMAL LOW (ref 34.8–46.6)
HEMOGLOBIN: 9.1 g/dL — AB (ref 11.6–15.9)
LYMPH#: 0.9 10*3/uL (ref 0.9–3.3)
LYMPH%: 75.4 % — ABNORMAL HIGH (ref 14.0–49.7)
MCH: 30.7 pg (ref 25.1–34.0)
MCHC: 34.9 g/dL (ref 31.5–36.0)
MCV: 88.2 fL (ref 79.5–101.0)
MONO#: 0.1 10*3/uL (ref 0.1–0.9)
MONO%: 9 % (ref 0.0–14.0)
NEUT#: 0.2 10*3/uL — CL (ref 1.5–6.5)
NEUT%: 14.8 % — AB (ref 38.4–76.8)
Platelets: 13 10*3/uL — ABNORMAL LOW (ref 145–400)
RBC: 2.96 10*6/uL — AB (ref 3.70–5.45)
RDW: 15 % — AB (ref 11.2–14.5)
WBC: 1.2 10*3/uL — ABNORMAL LOW (ref 3.9–10.3)
nRBC: 0 % (ref 0–0)

## 2017-02-10 LAB — COMPREHENSIVE METABOLIC PANEL
ALBUMIN: 3.8 g/dL (ref 3.5–5.0)
ALK PHOS: 86 U/L (ref 40–150)
ALT: 10 U/L (ref 0–55)
ANION GAP: 8 meq/L (ref 3–11)
AST: 16 U/L (ref 5–34)
BILIRUBIN TOTAL: 0.58 mg/dL (ref 0.20–1.20)
BUN: 15 mg/dL (ref 7.0–26.0)
CO2: 23 mEq/L (ref 22–29)
Calcium: 9 mg/dL (ref 8.4–10.4)
Chloride: 110 mEq/L — ABNORMAL HIGH (ref 98–109)
Creatinine: 0.9 mg/dL (ref 0.6–1.1)
GLUCOSE: 123 mg/dL (ref 70–140)
Potassium: 4 mEq/L (ref 3.5–5.1)
Sodium: 141 mEq/L (ref 136–145)
TOTAL PROTEIN: 6.8 g/dL (ref 6.4–8.3)

## 2017-02-10 LAB — LACTATE DEHYDROGENASE: LDH: 135 U/L (ref 125–245)

## 2017-02-10 LAB — URIC ACID: Uric Acid, Serum: 5.1 mg/dl (ref 2.6–7.4)

## 2017-02-10 LAB — MAGNESIUM: Magnesium: 2 mg/dl (ref 1.5–2.5)

## 2017-02-10 MED ORDER — DIPHENHYDRAMINE HCL 25 MG PO CAPS
25.0000 mg | ORAL_CAPSULE | Freq: Once | ORAL | Status: AC
  Administered 2017-02-10: 25 mg via ORAL
  Filled 2017-02-10: qty 1

## 2017-02-10 MED ORDER — SODIUM CHLORIDE 0.9% FLUSH
10.0000 mL | INTRAVENOUS | Status: AC | PRN
  Administered 2017-02-10: 10 mL

## 2017-02-10 MED ORDER — ACETAMINOPHEN 325 MG PO TABS: 650.0000 mg | ORAL_TABLET | Freq: Once | ORAL | Status: AC

## 2017-02-10 MED ORDER — ACETAMINOPHEN 325 MG PO TABS
650.0000 mg | ORAL_TABLET | Freq: Once | ORAL | Status: AC
  Administered 2017-02-10: 650 mg via ORAL
  Filled 2017-02-10: qty 2

## 2017-02-10 MED ORDER — SODIUM CHLORIDE 0.9% FLUSH: 10.0000 mL | INTRAVENOUS | Status: DC | PRN

## 2017-02-10 MED ORDER — HEPARIN SOD (PORK) LOCK FLUSH 100 UNIT/ML IV SOLN: 500.0000 [IU] | Freq: Every day | INTRAVENOUS | Status: DC | PRN

## 2017-02-10 MED ORDER — HEPARIN SOD (PORK) LOCK FLUSH 100 UNIT/ML IV SOLN
500.0000 [IU] | Freq: Once | INTRAVENOUS | Status: AC
  Administered 2017-02-10: 500 [IU]
  Filled 2017-02-10: qty 5

## 2017-02-10 MED ORDER — SODIUM CHLORIDE 0.9 % IV SOLN
250.0000 mL | Freq: Once | INTRAVENOUS | Status: AC
  Administered 2017-02-10: 250 mL via INTRAVENOUS

## 2017-02-10 MED ORDER — DIPHENHYDRAMINE HCL 25 MG PO CAPS: 25.0000 mg | ORAL_CAPSULE | Freq: Once | ORAL | Status: AC

## 2017-02-10 MED ORDER — HEPARIN SOD (PORK) LOCK FLUSH 100 UNIT/ML IV SOLN: 250.0000 [IU] | INTRAVENOUS | Status: DC | PRN

## 2017-02-10 NOTE — Progress Notes (Signed)
Provider: Ky Barban  Diagnosis Association: Acute myeloid leukemia not having achieved remission ([place]) (C92.00)  Treatment: Platelets via port a cath  Patient tolerated procedure well with no transfusion reaction. Discharge instructions given to patient and patient states an understanding. Patient alert, oriented, and ambulatory at time of discharge.

## 2017-02-10 NOTE — Telephone Encounter (Signed)
Sickle cell RN and pt notified she is to receive platelets today. Pt denies bleeding.

## 2017-02-10 NOTE — Discharge Instructions (Signed)
Patient received Platelets via port a cath on 02/10/17.

## 2017-02-11 LAB — PREPARE PLATELET PHERESIS: Unit division: 0

## 2017-02-11 LAB — BPAM PLATELET PHERESIS
BLOOD PRODUCT EXPIRATION DATE: 201812232359
ISSUE DATE / TIME: 201812211229
UNIT TYPE AND RH: 7300

## 2017-02-13 ENCOUNTER — Ambulatory Visit (HOSPITAL_BASED_OUTPATIENT_CLINIC_OR_DEPARTMENT_OTHER): Payer: Medicaid Other

## 2017-02-13 ENCOUNTER — Other Ambulatory Visit (HOSPITAL_BASED_OUTPATIENT_CLINIC_OR_DEPARTMENT_OTHER): Payer: Medicaid Other

## 2017-02-13 VITALS — BP 105/66 | HR 85 | Temp 98.1°F | Resp 18

## 2017-02-13 DIAGNOSIS — C9202 Acute myeloblastic leukemia, in relapse: Secondary | ICD-10-CM

## 2017-02-13 DIAGNOSIS — D61810 Antineoplastic chemotherapy induced pancytopenia: Secondary | ICD-10-CM | POA: Diagnosis not present

## 2017-02-13 DIAGNOSIS — C9200 Acute myeloblastic leukemia, not having achieved remission: Secondary | ICD-10-CM | POA: Diagnosis not present

## 2017-02-13 LAB — COMPREHENSIVE METABOLIC PANEL
ALT: 16 U/L (ref 0–55)
AST: 22 U/L (ref 5–34)
Albumin: 4 g/dL (ref 3.5–5.0)
Alkaline Phosphatase: 101 U/L (ref 40–150)
Anion Gap: 10 mEq/L (ref 3–11)
BILIRUBIN TOTAL: 0.52 mg/dL (ref 0.20–1.20)
BUN: 17.1 mg/dL (ref 7.0–26.0)
CHLORIDE: 103 meq/L (ref 98–109)
CO2: 29 meq/L (ref 22–29)
CREATININE: 1.1 mg/dL (ref 0.6–1.1)
Calcium: 10.2 mg/dL (ref 8.4–10.4)
EGFR: >60 mL/min/{1.73_m2} (ref >60)
GLUCOSE: 101 mg/dL (ref 70–140)
Potassium: 4.2 mEq/L (ref 3.5–5.1)
SODIUM: 142 meq/L (ref 136–145)
TOTAL PROTEIN: 7.5 g/dL (ref 6.4–8.3)

## 2017-02-13 LAB — CBC WITH DIFFERENTIAL/PLATELET
BASO%: 0 % (ref 0.0–2.0)
Basophils Absolute: 0 10*3/uL (ref 0.0–0.1)
EOS%: 0 % (ref 0.0–7.0)
Eosinophils Absolute: 0 10*3/uL (ref 0.0–0.5)
HCT: 27.8 % — ABNORMAL LOW (ref 34.8–46.6)
HGB: 9.8 g/dL — ABNORMAL LOW (ref 11.6–15.9)
LYMPH%: 80 % — ABNORMAL HIGH (ref 14.0–49.7)
MCH: 30.7 pg (ref 25.1–34.0)
MCHC: 35.3 g/dL (ref 31.5–36.0)
MCV: 87.1 fL (ref 79.5–101.0)
MONO#: 0.1 10*3/uL (ref 0.1–0.9)
MONO%: 10.4 % (ref 0.0–14.0)
NEUT%: 9.6 % — ABNORMAL LOW (ref 38.4–76.8)
NEUTROS ABS: 0.1 10*3/uL — AB (ref 1.5–6.5)
NRBC: 0 % (ref 0–0)
Platelets: 16 10*3/uL — ABNORMAL LOW (ref 145–400)
RBC: 3.19 10*6/uL — AB (ref 3.70–5.45)
RDW: 14.9 % — ABNORMAL HIGH (ref 11.2–14.5)
WBC: 1.3 10*3/uL — AB (ref 3.9–10.3)
lymph#: 1 10*3/uL (ref 0.9–3.3)

## 2017-02-13 LAB — MAGNESIUM: Magnesium: 2.1 mg/dl (ref 1.5–2.5)

## 2017-02-13 LAB — LACTATE DEHYDROGENASE: LDH: 159 U/L (ref 125–245)

## 2017-02-13 LAB — URIC ACID: URIC ACID, SERUM: 5.2 mg/dL (ref 2.6–7.4)

## 2017-02-13 MED ORDER — ACETAMINOPHEN 325 MG PO TABS
ORAL_TABLET | ORAL | Status: AC
  Filled 2017-02-13: qty 2

## 2017-02-13 MED ORDER — ACETAMINOPHEN 325 MG PO TABS
650.0000 mg | ORAL_TABLET | Freq: Once | ORAL | Status: AC
  Administered 2017-02-13: 650 mg via ORAL

## 2017-02-13 MED ORDER — DIPHENHYDRAMINE HCL 25 MG PO CAPS
ORAL_CAPSULE | ORAL | Status: AC
  Filled 2017-02-13: qty 1

## 2017-02-13 MED ORDER — DIPHENHYDRAMINE HCL 25 MG PO CAPS
25.0000 mg | ORAL_CAPSULE | Freq: Once | ORAL | Status: AC
  Administered 2017-02-13: 25 mg via ORAL

## 2017-02-13 MED ORDER — HEPARIN SOD (PORK) LOCK FLUSH 100 UNIT/ML IV SOLN
500.0000 [IU] | Freq: Once | INTRAVENOUS | Status: AC
  Administered 2017-02-13: 500 [IU]
  Filled 2017-02-13: qty 5

## 2017-02-13 MED ORDER — SODIUM CHLORIDE 0.9 % IV SOLN
250.0000 mL | Freq: Once | INTRAVENOUS | Status: AC
  Administered 2017-02-13: 250 mL via INTRAVENOUS

## 2017-02-13 MED ORDER — SODIUM CHLORIDE 0.9% FLUSH
10.0000 mL | Freq: Once | INTRAVENOUS | Status: AC
  Administered 2017-02-13: 10 mL
  Filled 2017-02-13: qty 10

## 2017-02-13 NOTE — Patient Instructions (Signed)
Platelet Transfusion, Care After °Refer to this sheet in the next few weeks. These instructions provide you with information about caring for yourself after your procedure. Your health care provider may also give you more specific instructions. Your treatment has been planned according to current medical practices, but problems sometimes occur. Call your health care provider if you have any problems or questions after your procedure. °What can I expect after the procedure? °After the procedure, it is common to have: °· Bruising and soreness at the IV site. °· Fever or chills within the first 48 hours of your transfusion. ° °Follow these instructions at home: °· Take medicines only as directed by your health care provider. Ask your health care provider if you can take an over-the-counter pain reliever in case you have a fever or headache a day or two after your transfusion. °· Return to your normal activities as directed by your health care provider. °Contact a health care provider if: °· You have a fever. °· You have a headache. °· You have redness, swelling, or pain at your IV site. °· You have skin itching or a rash. °· You vomit. °· You feel unusually tired or weak. °Get help right away if: °· You have trouble breathing. °· You have a decreased amount of urine or you urinate less often than you normally do. °· Your urine is darker than normal. °· You have pain in your back, abdomen, or chest. °· You have cool, clammy skin. °· You have a rapid heartbeat. °This information is not intended to replace advice given to you by your health care provider. Make sure you discuss any questions you have with your health care provider. °Document Released: 02/28/2014 Document Revised: 07/16/2015 Document Reviewed: 12/18/2013 °Elsevier Interactive Patient Education © 2018 Elsevier Inc. ° °

## 2017-02-14 LAB — PREPARE PLATELET PHERESIS: UNIT DIVISION: 0

## 2017-02-14 LAB — BPAM PLATELET PHERESIS
BLOOD PRODUCT EXPIRATION DATE: 201812242359
ISSUE DATE / TIME: 201812241415
Unit Type and Rh: 7300

## 2017-02-16 LAB — FECAL OCCULT BLOOD, IMMUNOCHEMICAL: FECAL OCCULT BLD: NEGATIVE

## 2017-02-17 ENCOUNTER — Telehealth (INDEPENDENT_AMBULATORY_CARE_PROVIDER_SITE_OTHER): Payer: Self-pay

## 2017-02-17 NOTE — Telephone Encounter (Signed)
-----   Message from Clent Demark, PA-C sent at 02/17/2017 12:20 PM EST ----- Negative FIT.

## 2017-02-17 NOTE — Telephone Encounter (Signed)
Patient phone rang several times before disconnecting. Will call again. Nat Christen, CMA

## 2017-02-20 ENCOUNTER — Telehealth (INDEPENDENT_AMBULATORY_CARE_PROVIDER_SITE_OTHER): Payer: Self-pay

## 2017-02-20 NOTE — Telephone Encounter (Signed)
Patient is aware of negative Morgan Waters, CMA

## 2017-02-20 NOTE — Telephone Encounter (Signed)
-----   Message from Clent Demark, PA-C sent at 02/17/2017 12:20 PM EST ----- Negative FIT.

## 2017-04-13 ENCOUNTER — Telehealth: Payer: Self-pay | Admitting: *Deleted

## 2017-04-13 ENCOUNTER — Other Ambulatory Visit: Payer: Self-pay | Admitting: *Deleted

## 2017-04-13 DIAGNOSIS — C9202 Acute myeloblastic leukemia, in relapse: Secondary | ICD-10-CM

## 2017-04-13 NOTE — Telephone Encounter (Signed)
Received call from son Garnette Gunner requesting a call back from nurse.  Spoke with Alvester Chou, and was informed that pt c/o legs numbness.  Pt receives treatment at [facility].  Dr. Burr Medico notified.  Spoke with Alvester Chou, and instructed him to contact pt's provider at [facility] for legs numbness issue as per Dr. Ernestina Penna instructions.  Alvester Chou voiced understanding. [facility]    Phone      [phone].

## 2017-04-14 ENCOUNTER — Telehealth: Payer: Self-pay | Admitting: Hematology

## 2017-04-14 NOTE — Telephone Encounter (Signed)
Appointments scheduled as per 2/21 sch msg and patietn was contacted with appointment info as well.  I am mailing a calendar to her also.

## 2017-04-17 ENCOUNTER — Other Ambulatory Visit: Payer: Medicaid Other

## 2017-04-17 ENCOUNTER — Other Ambulatory Visit: Payer: Self-pay | Admitting: *Deleted

## 2017-04-17 ENCOUNTER — Inpatient Hospital Stay: Payer: Medicaid Other | Attending: Hematology

## 2017-04-17 ENCOUNTER — Inpatient Hospital Stay: Payer: Medicaid Other

## 2017-04-17 DIAGNOSIS — C9202 Acute myeloblastic leukemia, in relapse: Secondary | ICD-10-CM | POA: Insufficient documentation

## 2017-04-17 DIAGNOSIS — Z79899 Other long term (current) drug therapy: Secondary | ICD-10-CM | POA: Diagnosis not present

## 2017-04-17 LAB — SAMPLE TO BLOOD BANK

## 2017-04-17 LAB — CBC WITH DIFFERENTIAL (CANCER CENTER ONLY)
BASOS PCT: 1 %
Basophils Absolute: 0 10*3/uL (ref 0.0–0.1)
EOS PCT: 1 %
Eosinophils Absolute: 0 10*3/uL (ref 0.0–0.5)
HCT: 28.1 % — ABNORMAL LOW (ref 34.8–46.6)
Hemoglobin: 10 g/dL — ABNORMAL LOW (ref 11.6–15.9)
Lymphocytes Relative: 66 %
Lymphs Abs: 0.5 10*3/uL — ABNORMAL LOW (ref 0.9–3.3)
MCH: 29 pg (ref 25.1–34.0)
MCHC: 35.6 g/dL (ref 31.5–36.0)
MCV: 81.4 fL (ref 79.5–101.0)
MONO ABS: 0.2 10*3/uL (ref 0.1–0.9)
MONOS PCT: 25 %
Neutro Abs: 0.1 10*3/uL — CL (ref 1.5–6.5)
Neutrophils Relative %: 7 %
PLATELETS: 13 10*3/uL — AB (ref 145–400)
RBC: 3.45 MIL/uL — ABNORMAL LOW (ref 3.70–5.45)
RDW: 11.6 % (ref 11.2–14.5)
WBC Count: 0.8 10*3/uL — CL (ref 3.9–10.3)

## 2017-04-17 LAB — CMP (CANCER CENTER ONLY)
ALBUMIN: 4 g/dL (ref 3.5–5.0)
ALK PHOS: 89 U/L (ref 40–150)
ALT: 15 U/L (ref 0–55)
AST: 30 U/L (ref 5–34)
Anion gap: 16 — ABNORMAL HIGH (ref 3–11)
BILIRUBIN TOTAL: 1.6 mg/dL — AB (ref 0.2–1.2)
BUN: 33 mg/dL — AB (ref 7–26)
CALCIUM: 10.6 mg/dL — AB (ref 8.4–10.4)
CO2: 32 mmol/L — ABNORMAL HIGH (ref 22–29)
CREATININE: 0.98 mg/dL (ref 0.60–1.10)
Chloride: 93 mmol/L — ABNORMAL LOW (ref 98–109)
GFR, Est AFR Am: >60 mL/min (ref >60)
GFR, Estimated: >60 mL/min (ref >60)
GLUCOSE: 154 mg/dL — AB (ref 70–140)
Potassium: 3 mmol/L — CL (ref 3.5–5.1)
Sodium: 141 mmol/L (ref 136–145)
Total Protein: 8.2 g/dL (ref 6.4–8.3)

## 2017-04-17 LAB — MAGNESIUM: Magnesium: 2.2 mg/dL (ref 1.5–2.5)

## 2017-04-17 LAB — URIC ACID: Uric Acid, Serum: 8.7 mg/dL — ABNORMAL HIGH (ref 2.6–7.4)

## 2017-04-17 LAB — LACTATE DEHYDROGENASE: LDH: 238 U/L (ref 125–245)

## 2017-04-17 NOTE — Patient Instructions (Signed)

## 2017-04-17 NOTE — Progress Notes (Signed)
Pt platelets today is 13,000. Per parameters, pt to receive 1 unit of HLA matched platelets with platelet count less than 15,000. Notified Dr.Feng, per [facility] and is aware. Will need sample sent to [facility] and may take 1-2 days to obtain HLA matched platelets.   Desk RN will contact [facility] today to see if they will have platelets available for pt sooner. Pending update.   Left arm double lumen picc line changed today. Applied new dressing and is C/D/I.  ANC 0.8 today. Advised pt to practice neutropenic precaution, hand washing, checking temperature and wearing a mask when outside. Pt and son verbalized understanding and will monitor s/s of infection or any other concern regarding neutropenia.    1200- Pt to return to [facility] tomorrow to receive platelets per [place], Therapist, sports. Pt discharge without any issues. Pt and son aware of plan.

## 2017-04-20 ENCOUNTER — Other Ambulatory Visit: Payer: Self-pay | Admitting: *Deleted

## 2017-04-20 ENCOUNTER — Inpatient Hospital Stay: Payer: Medicaid Other

## 2017-04-20 ENCOUNTER — Encounter (HOSPITAL_COMMUNITY): Payer: Medicaid Other

## 2017-04-20 ENCOUNTER — Other Ambulatory Visit: Payer: Medicaid Other

## 2017-04-20 ENCOUNTER — Telehealth: Payer: Self-pay | Admitting: *Deleted

## 2017-04-20 VITALS — BP 103/81 | HR 116 | Temp 98.4°F | Resp 16

## 2017-04-20 DIAGNOSIS — C9202 Acute myeloblastic leukemia, in relapse: Secondary | ICD-10-CM | POA: Diagnosis not present

## 2017-04-20 DIAGNOSIS — Z452 Encounter for adjustment and management of vascular access device: Secondary | ICD-10-CM

## 2017-04-20 LAB — URIC ACID: Uric Acid, Serum: 7.2 mg/dL (ref 2.6–7.4)

## 2017-04-20 LAB — CMP (CANCER CENTER ONLY)
ALBUMIN: 3.7 g/dL (ref 3.5–5.0)
ALT: 11 U/L (ref 0–55)
AST: 27 U/L (ref 5–34)
Alkaline Phosphatase: 85 U/L (ref 40–150)
Anion gap: 13 — ABNORMAL HIGH (ref 3–11)
BUN: 18 mg/dL (ref 7–26)
CHLORIDE: 93 mmol/L — AB (ref 98–109)
CO2: 34 mmol/L — ABNORMAL HIGH (ref 22–29)
Calcium: 10.1 mg/dL (ref 8.4–10.4)
Creatinine: 0.87 mg/dL (ref 0.60–1.10)
GFR, Est AFR Am: >60 mL/min (ref >60)
Glucose, Bld: 108 mg/dL (ref 70–140)
POTASSIUM: 3.2 mmol/L — AB (ref 3.5–5.1)
Sodium: 140 mmol/L (ref 136–145)
Total Bilirubin: 1 mg/dL (ref 0.2–1.2)
Total Protein: 7.3 g/dL (ref 6.4–8.3)

## 2017-04-20 LAB — CBC WITH DIFFERENTIAL (CANCER CENTER ONLY)
Basophils Absolute: 0 10*3/uL (ref 0.0–0.1)
Basophils Relative: 0 %
EOS PCT: 2 %
Eosinophils Absolute: 0 10*3/uL (ref 0.0–0.5)
HEMATOCRIT: 22.7 % — AB (ref 34.8–46.6)
HEMOGLOBIN: 8.1 g/dL — AB (ref 11.6–15.9)
LYMPHS ABS: 0.6 10*3/uL — AB (ref 0.9–3.3)
LYMPHS PCT: 46 %
MCH: 28.7 pg (ref 25.1–34.0)
MCHC: 35.7 g/dL (ref 31.5–36.0)
MCV: 80.5 fL (ref 79.5–101.0)
Monocytes Absolute: 0.6 10*3/uL (ref 0.1–0.9)
Monocytes Relative: 50 %
NEUTROS ABS: 0 10*3/uL — AB (ref 1.5–6.5)
Neutrophils Relative %: 2 %
Platelet Count: 21 10*3/uL — ABNORMAL LOW (ref 145–400)
RBC: 2.82 MIL/uL — AB (ref 3.70–5.45)
RDW: 11.6 % (ref 11.2–14.5)
WBC: 1.3 10*3/uL — AB (ref 3.9–10.3)

## 2017-04-20 LAB — LACTATE DEHYDROGENASE: LDH: 259 U/L — ABNORMAL HIGH (ref 125–245)

## 2017-04-20 LAB — ABO/RH: ABO/RH(D): O POS

## 2017-04-20 LAB — MAGNESIUM: MAGNESIUM: 1.7 mg/dL (ref 1.5–2.5)

## 2017-04-20 MED ORDER — ACETAMINOPHEN 325 MG PO TABS: 650.0000 mg | ORAL_TABLET | Freq: Once | ORAL | Status: DC

## 2017-04-20 MED ORDER — DIPHENHYDRAMINE HCL 25 MG PO TABS
25.0000 mg | ORAL_TABLET | Freq: Once | ORAL | Status: DC
  Filled 2017-04-20: qty 1

## 2017-04-20 MED ORDER — HEPARIN SOD (PORK) LOCK FLUSH 100 UNIT/ML IV SOLN
250.0000 [IU] | Freq: Once | INTRAVENOUS | Status: AC
  Administered 2017-04-20: 250 [IU] via INTRAVENOUS
  Filled 2017-04-20: qty 5

## 2017-04-20 MED ORDER — SODIUM CHLORIDE 0.9% FLUSH
3.0000 mL | Freq: Once | INTRAVENOUS | Status: AC
  Administered 2017-04-20: 3 mL via INTRAVENOUS
  Filled 2017-04-20: qty 10

## 2017-04-20 NOTE — Telephone Encounter (Signed)
Spoke with pt while in infusion room about lab results.  ANC  0.0  -   Gave pt a mask, and reinforced about neutropenic precautions.   Hgb  8.1  ;  Platelet   21 .   Pt stated she had seen Dr. Florene Glen at [facility] on Tuesday 04/18/17, and received platelet transfusion.   Stated she will be back at [facility] on Tuesday  04/25/17. Pt desired blood transfusion to help her feel better over the weekend.  Dr. Burr Medico approved.  Pt is scheduled for blood transfusion at [facility] at 0830 am  Friday  04/21/17.   Pt voiced understanding. Spoke with Elmyra Ricks, RN @ Dr. Florene Glen at [facility], and informed her of pt's decision to receive blood transfusion on 04/21/17. Confirmed with her that pt will see Dr. Florene Glen on 04/25/17. Elmyra Ricks stated she could see pt's lab results in [facility]   Direct   Phone      [phone].

## 2017-04-20 NOTE — Telephone Encounter (Signed)
"  I'm calling to check and see if I have an appointment for today." Lab only schedule today at 8:45 am.  "I'm unable to get there in minutes.  I could try to come in at 9:30 or later."  *Call transferred to scheduling to reschedule for a later time or different day.

## 2017-04-21 ENCOUNTER — Ambulatory Visit (HOSPITAL_COMMUNITY)
Admission: RE | Admit: 2017-04-21 | Discharge: 2017-04-21 | Disposition: A | Discharge status: Home / Self Care | Payer: Medicaid Other | Source: Ambulatory Visit | Attending: Hematology | Admitting: Hematology

## 2017-04-21 ENCOUNTER — Encounter (HOSPITAL_COMMUNITY): Payer: Medicaid Other

## 2017-04-21 DIAGNOSIS — C9202 Acute myeloblastic leukemia, in relapse: Secondary | ICD-10-CM | POA: Insufficient documentation

## 2017-04-24 ENCOUNTER — Other Ambulatory Visit: Payer: Medicaid Other

## 2017-04-24 ENCOUNTER — Ambulatory Visit (HOSPITAL_COMMUNITY)
Admission: RE | Admit: 2017-04-24 | Discharge: 2017-04-24 | Disposition: A | Discharge status: Home / Self Care | Payer: Medicaid Other | Source: Ambulatory Visit | Attending: Hematology | Admitting: Hematology

## 2017-04-24 ENCOUNTER — Telehealth: Payer: Self-pay | Admitting: *Deleted

## 2017-04-24 DIAGNOSIS — C9200 Acute myeloblastic leukemia, not having achieved remission: Secondary | ICD-10-CM | POA: Diagnosis present

## 2017-04-24 DIAGNOSIS — C9202 Acute myeloblastic leukemia, in relapse: Secondary | ICD-10-CM | POA: Diagnosis not present

## 2017-04-24 LAB — TYPE AND SCREEN
ABO/RH(D): O POS
Antibody Screen: NEGATIVE
UNIT DIVISION: 0
UNIT DIVISION: 0

## 2017-04-24 LAB — BPAM RBC
Blood Product Expiration Date: 201903282359
Blood Product Expiration Date: 201903282359
UNIT TYPE AND RH: 5100
Unit Type and Rh: 5100

## 2017-04-24 MED ORDER — SODIUM CHLORIDE 0.9% FLUSH: 10.0000 mL | Freq: Once | INTRAVENOUS | Status: DC

## 2017-04-24 MED ORDER — SODIUM CHLORIDE 0.9 % IV SOLN: 250.0000 mL | Freq: Once | INTRAVENOUS | Status: DC

## 2017-04-24 MED ORDER — HEPARIN SOD (PORK) LOCK FLUSH 100 UNIT/ML IV SOLN
250.0000 [IU] | INTRAVENOUS | Status: AC | PRN
  Administered 2017-04-24: 250 [IU]

## 2017-04-24 NOTE — Telephone Encounter (Signed)
Pt was  NO  SHOW for scheduled lab appt today.  Called pt at home without answer.   Unable to leave message due to voice mail full.

## 2017-04-24 NOTE — Progress Notes (Signed)
Pt received 2 units PRBC today at [facility].  No complications noted. Pt discharged in stable, ambulatory condition.  Coolidge Breeze, RN 04/24/2017

## 2017-04-25 LAB — TYPE AND SCREEN
ABO/RH(D): O POS
ANTIBODY SCREEN: NEGATIVE
UNIT DIVISION: 0
Unit division: 0

## 2017-04-25 LAB — BPAM RBC
BLOOD PRODUCT EXPIRATION DATE: 201903282359
Blood Product Expiration Date: 201903282359
ISSUE DATE / TIME: 201903040928
ISSUE DATE / TIME: 201903040928
UNIT TYPE AND RH: 5100
Unit Type and Rh: 5100

## 2017-04-26 ENCOUNTER — Telehealth: Payer: Self-pay | Admitting: *Deleted

## 2017-04-26 NOTE — Telephone Encounter (Signed)
Faxed appt calendar to Darliss Cheney, RN @ [facility] as per nurse request. [facility]     Fax     [phone]     ;      Phone       [phone].

## 2017-04-27 ENCOUNTER — Other Ambulatory Visit: Payer: Medicaid Other

## 2017-04-28 ENCOUNTER — Telehealth: Payer: Self-pay | Admitting: *Deleted

## 2017-04-28 NOTE — Telephone Encounter (Signed)
Spoke with Darliss Cheney, RN @ [facility], and was informed that pt is currently at [facility].  Will be discharged on  05/04/17.   Pt to begin lab draws at [facility]  Twice weekly with supportive care as needed. Faxed appt calendar to [facility] as per nurse's request. Christy[facility]   Direct   Phone      [phone]      ;      Fax       [phone].Marland Kitchen

## 2017-05-01 ENCOUNTER — Other Ambulatory Visit: Payer: Medicaid Other

## 2017-05-04 ENCOUNTER — Other Ambulatory Visit: Payer: Medicaid Other

## 2017-05-05 ENCOUNTER — Telehealth: Payer: Self-pay | Admitting: *Deleted

## 2017-05-05 NOTE — Telephone Encounter (Signed)
Received call from Darliss Cheney, RN @ [facility] requesting a call back from nurse.   Spoke with [place], and was informed that pt is not doing well, and probably will not be discharged home this weekend.  Alyse Low stated that pt will not be coming for lab appt on  3/18 at this clinic.  Stated pt had cxr which showed probable fungal infection.  Alyse Low stated she would keep Dr. Burr Medico updated with pt's progress. [facility]    Phone      [phone].

## 2017-05-08 ENCOUNTER — Other Ambulatory Visit: Payer: Medicaid Other

## 2017-05-08 ENCOUNTER — Ambulatory Visit: Payer: Medicaid Other

## 2017-05-11 ENCOUNTER — Emergency Department (HOSPITAL_COMMUNITY): Payer: Medicaid Other

## 2017-05-11 ENCOUNTER — Other Ambulatory Visit: Payer: Medicaid Other

## 2017-05-11 ENCOUNTER — Emergency Department (HOSPITAL_COMMUNITY)
Admission: EM | Admit: 2017-05-11 | Discharge: 2017-05-11 | Disposition: A | Discharge status: Other Acute Inpatient Hospital | Payer: Medicaid Other | Attending: Emergency Medicine | Admitting: Emergency Medicine

## 2017-05-11 ENCOUNTER — Other Ambulatory Visit: Payer: Self-pay

## 2017-05-11 DIAGNOSIS — R05 Cough: Secondary | ICD-10-CM | POA: Insufficient documentation

## 2017-05-11 DIAGNOSIS — Z79899 Other long term (current) drug therapy: Secondary | ICD-10-CM | POA: Insufficient documentation

## 2017-05-11 DIAGNOSIS — K1379 Other lesions of oral mucosa: Secondary | ICD-10-CM | POA: Insufficient documentation

## 2017-05-11 DIAGNOSIS — J189 Pneumonia, unspecified organism: Secondary | ICD-10-CM | POA: Diagnosis not present

## 2017-05-11 DIAGNOSIS — C9202 Acute myeloblastic leukemia, in relapse: Secondary | ICD-10-CM | POA: Diagnosis not present

## 2017-05-11 DIAGNOSIS — R112 Nausea with vomiting, unspecified: Secondary | ICD-10-CM | POA: Diagnosis present

## 2017-05-11 DIAGNOSIS — D696 Thrombocytopenia, unspecified: Secondary | ICD-10-CM | POA: Insufficient documentation

## 2017-05-11 LAB — I-STAT CHEM 8, ED
BUN: 16 mg/dL (ref 6–20)
CALCIUM ION: 1.11 mmol/L — AB (ref 1.15–1.40)
Chloride: 104 mmol/L (ref 101–111)
Creatinine, Ser: 0.5 mg/dL (ref 0.44–1.00)
Glucose, Bld: 123 mg/dL — ABNORMAL HIGH (ref 65–99)
HCT: 24 % — ABNORMAL LOW (ref 36.0–46.0)
HEMOGLOBIN: 8.2 g/dL — AB (ref 12.0–15.0)
Potassium: 2.4 mmol/L — CL (ref 3.5–5.1)
Sodium: 151 mmol/L — ABNORMAL HIGH (ref 135–145)
TCO2: 31 mmol/L (ref 22–32)

## 2017-05-11 LAB — CBC WITH DIFFERENTIAL/PLATELET
Basophils Absolute: 0 10*3/uL (ref 0.0–0.1)
Basophils Relative: 0 %
EOS ABS: 0 10*3/uL (ref 0.0–0.7)
Eosinophils Relative: 0 %
HCT: 26 % — ABNORMAL LOW (ref 36.0–46.0)
HEMOGLOBIN: 8.7 g/dL — AB (ref 12.0–15.0)
Lymphocytes Relative: 5 %
Lymphs Abs: 0.3 10*3/uL — ABNORMAL LOW (ref 0.7–4.0)
MCH: 29.3 pg (ref 26.0–34.0)
MCHC: 33.5 g/dL (ref 30.0–36.0)
MCV: 87.5 fL (ref 78.0–100.0)
MONO ABS: 4 10*3/uL — AB (ref 0.1–1.0)
MONOS PCT: 63 %
NEUTROS ABS: 2 10*3/uL (ref 1.7–7.7)
Neutrophils Relative %: 32 %
Other: 0 %
RBC: 2.97 MIL/uL — AB (ref 3.87–5.11)
RDW: 14 % (ref 11.5–15.5)
WBC: 6.3 10*3/uL (ref 4.0–10.5)

## 2017-05-11 LAB — COMPREHENSIVE METABOLIC PANEL
ALT: 9 U/L — ABNORMAL LOW (ref 14–54)
ANION GAP: 15 (ref 5–15)
AST: 22 U/L (ref 15–41)
Albumin: 2.6 g/dL — ABNORMAL LOW (ref 3.5–5.0)
Alkaline Phosphatase: 54 U/L (ref 38–126)
BUN: 18 mg/dL (ref 6–20)
CHLORIDE: 107 mmol/L (ref 101–111)
CO2: 31 mmol/L (ref 22–32)
Calcium: 8.4 mg/dL — ABNORMAL LOW (ref 8.9–10.3)
Creatinine, Ser: 0.63 mg/dL (ref 0.44–1.00)
GFR calc non Af Amer: >60 mL/min (ref >60)
Glucose, Bld: 131 mg/dL — ABNORMAL HIGH (ref 65–99)
POTASSIUM: 2.5 mmol/L — AB (ref 3.5–5.1)
SODIUM: 153 mmol/L — AB (ref 135–145)
Total Bilirubin: 1.2 mg/dL (ref 0.3–1.2)
Total Protein: 5.9 g/dL — ABNORMAL LOW (ref 6.5–8.1)

## 2017-05-11 LAB — I-STAT BETA HCG BLOOD, ED (MC, WL, AP ONLY): I-stat hCG, quantitative: <5 m[IU]/mL (ref <5)

## 2017-05-11 LAB — TYPE AND SCREEN
ABO/RH(D): O POS
Antibody Screen: NEGATIVE

## 2017-05-11 LAB — MAGNESIUM: MAGNESIUM: 1.5 mg/dL — AB (ref 1.7–2.4)

## 2017-05-11 LAB — I-STAT CG4 LACTIC ACID, ED: LACTIC ACID, VENOUS: 1.16 mmol/L (ref 0.5–1.9)

## 2017-05-11 LAB — PROTIME-INR
INR: 1.36
PROTHROMBIN TIME: 16.6 s — AB (ref 11.4–15.2)

## 2017-05-11 LAB — LIPASE, BLOOD: Lipase: 16 U/L (ref 11–51)

## 2017-05-11 MED ORDER — LACTATED RINGERS IV SOLN
INTRAVENOUS | Status: DC
Start: ? — End: 2017-05-11

## 2017-05-11 MED ORDER — ONDANSETRON HCL 4 MG/2ML IJ SOLN
4.0000 mg | Freq: Once | INTRAMUSCULAR | Status: AC
  Administered 2017-05-11: 4 mg via INTRAVENOUS
  Filled 2017-05-11: qty 2

## 2017-05-11 MED ORDER — GENERIC EXTERNAL MEDICATION
2.00 g | Status: DC
Start: 2017-05-12 — End: 2017-05-11

## 2017-05-11 MED ORDER — VORICONAZOLE 50 MG PO TABS
200.00 | ORAL_TABLET | ORAL | Status: DC
Start: 2017-05-12 — End: 2017-05-11

## 2017-05-11 MED ORDER — SODIUM CHLORIDE 0.9 % IV SOLN
Freq: Once | INTRAVENOUS | Status: AC
  Administered 2017-05-11: 12:00:00 via INTRAVENOUS

## 2017-05-11 MED ORDER — GENERIC EXTERNAL MEDICATION
8.00 | Status: DC
Start: 2017-05-11 — End: 2017-05-11

## 2017-05-11 MED ORDER — PROCHLORPERAZINE EDISYLATE 5 MG/ML IJ SOLN
5.00 | INTRAMUSCULAR | Status: DC
Start: ? — End: 2017-05-11

## 2017-05-11 MED ORDER — PANTOPRAZOLE SODIUM 40 MG IV SOLR
40.00 | INTRAVENOUS | Status: DC
Start: 2017-05-15 — End: 2017-05-11

## 2017-05-11 MED ORDER — POTASSIUM CHLORIDE 20 MEQ/100ML IV SOLN
INTRAVENOUS | Status: DC
Start: 2017-05-11 — End: 2017-05-11

## 2017-05-11 MED ORDER — FENTANYL CITRATE (PF) 100 MCG/2ML IJ SOLN
50.0000 ug | Freq: Once | INTRAMUSCULAR | Status: AC
  Administered 2017-05-11: 50 ug via INTRAVENOUS
  Filled 2017-05-11: qty 2

## 2017-05-11 MED ORDER — MAGIC MOUTHWASH
10.0000 mL | Freq: Once | ORAL | Status: AC
  Administered 2017-05-11: 10 mL via ORAL
  Filled 2017-05-11: qty 10

## 2017-05-11 MED ORDER — LACTATED RINGERS IV BOLUS (SEPSIS)
1000.0000 mL | Freq: Once | INTRAVENOUS | Status: AC
  Administered 2017-05-11: 1000 mL via INTRAVENOUS

## 2017-05-11 MED ORDER — ACYCLOVIR 400 MG PO TABS
400.00 | ORAL_TABLET | ORAL | Status: DC
Start: 2017-05-15 — End: 2017-05-11

## 2017-05-11 MED ORDER — SODIUM CHLORIDE 0.9 % IV SOLN
80.0000 mg | Freq: Once | INTRAVENOUS | Status: AC
  Administered 2017-05-11: 80 mg via INTRAVENOUS
  Filled 2017-05-11: qty 80

## 2017-05-11 MED ORDER — POTASSIUM CHLORIDE 10 MEQ/100ML IV SOLN
10.0000 meq | INTRAVENOUS | Status: DC
  Administered 2017-05-11 (×3): 10 meq via INTRAVENOUS
  Filled 2017-05-11 (×3): qty 100

## 2017-05-11 MED ORDER — VORICONAZOLE 50 MG PO TABS
200.00 | ORAL_TABLET | ORAL | Status: DC
Start: 2017-05-19 — End: 2017-05-11

## 2017-05-11 MED ORDER — SODIUM CHLORIDE 0.9 % IV SOLN: 2.0000 g | Freq: Once | INTRAVENOUS | Status: DC

## 2017-05-11 MED ORDER — ALLOPURINOL 300 MG PO TABS
300.00 | ORAL_TABLET | ORAL | Status: DC
Start: 2017-05-12 — End: 2017-05-11

## 2017-05-11 MED ORDER — MAGNESIUM SULFATE 2 GM/50ML IV SOLN
2.0000 g | Freq: Once | INTRAVENOUS | Status: AC
  Administered 2017-05-11: 2 g via INTRAVENOUS
  Filled 2017-05-11: qty 50

## 2017-05-11 MED ORDER — SODIUM CHLORIDE 0.9 % IV SOLN
250.00 | INTRAVENOUS | Status: DC
Start: 2017-05-11 — End: 2017-05-11

## 2017-05-11 MED ORDER — GENERIC EXTERNAL MEDICATION
6.00 | Status: DC
Start: 2017-05-11 — End: 2017-05-11

## 2017-05-11 NOTE — ED Notes (Signed)
Spoke wih Ester in the blood bank regarding platelets needing to be crossmatched/HLA for this patient per her oncologist, spoke with Dr. Myrene Buddy who is okay with patient getting [facility] at [facility] since she is being transferred within the next hour and we wont have them ready on time. RN Benjamine Mola aware. Blood bank here to call blood bank at [facility] to let them know the situation.

## 2017-05-11 NOTE — ED Notes (Signed)
Per patient, "ok to call family and let them know she is going to [facility]"

## 2017-05-11 NOTE — ED Provider Notes (Addendum)
[facility] DEPT Provider Note   CSN: [id number] Arrival date & time: 05/11/17  1106     History   Chief Complaint Chief Complaint  Patient presents with  . Nausea    ca patient  . Emesis    HPI Morgan Waters is a 52 y.o. female.  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  History is somewhat limited due to difficulty talking.  The patient reports that over the last several weeks, she is had persistent nausea, vomiting, and generalized weakness.  The patient symptoms were noticed during her recent admission at [facility] and she left AMA after treatment for possible fungal pneumonia.  She states that since then, her cough has somewhat improved.  She has had persistent nausea and vomiting.  Over the last several days, she is began spitting up small amounts of blood.  She states she is also had significant pain on her tongue that is worse when she speaks.  She denies any blood in her stools.  Denies any melena.  She reports generalized fatigue.  Denies any fevers.  She is currently receiving chemotherapy with Dr. Florene Glen at [facility].  Past Medical History:  Diagnosis Date  . Leukemia ([place])   . Migraine     Patient Active Problem List   Diagnosis Date Noted  . Nausea vomiting and diarrhea   . Fever   . Sepsis ([place]) 11/19/2016  . AKI (acute kidney injury) ([place]) 11/19/2016  . Gastroenteritis 11/19/2016  . Dehydration 11/19/2016  . Migraine 11/17/2016  . AML (acute myeloblastic leukemia) ([place]) 05/13/2016  . Other pancytopenia ([place]) 05/04/2016    Past Surgical History:  Procedure Laterality Date  . ABDOMINAL HYSTERECTOMY    . CESAREAN SECTION      OB History   None      Home Medications    Prior to Admission medications   Medication Sig Start Date End Date Taking? Authorizing Provider  acyclovir (ZOVIRAX) 400 MG  tablet Take 400 mg by mouth daily. 07/29/16   [provider]  amitriptyline (ELAVIL) 25 MG tablet Take 1 tablet (25 mg total) by mouth at bedtime. 02/07/17   Clent Demark, PA-C  ibuprofen (ADVIL,MOTRIN) 200 MG tablet Take 200 mg by mouth every 6 (six) hours as needed for headache, mild pain or moderate pain.     [provider]  loperamide (IMODIUM) 2 MG capsule take 1 capsule by mouth four times a day if needed for diarrhea or LOOSE STOOLS Patient not taking: Reported on 02/07/2017 02/02/17   Clent Demark, PA-C  nystatin (MYCOSTATIN) 100000 UNIT/ML suspension Take 5 mLs (500,000 Units total) by mouth 4 (four) times daily. 08/04/16   Truitt Merle, MD  polyvinyl alcohol (LIQUIFILM TEARS) 1.4 % ophthalmic solution Place 2 drops into both eyes every 4 (four) hours. 07/29/16   [provider]  potassium chloride SA (K-DUR,KLOR-CON) 20 MEQ tablet Take 1 tablet (20 mEq total) by mouth as directed. Take 40 meq daily for 4 days, then take 20 meq daily until further instructions. Patient not taking: Reported on 02/07/2017 11/29/16   Truitt Merle, MD  prednisoLONE acetate (PRED FORTE) 1 % ophthalmic suspension Place 2 drops into both eyes every 4 (four) hours. 07/29/16   [provider]  promethazine (PHENERGAN) 25 MG tablet Take 50 mg by mouth daily. 06/29/16   [provider]  SUMAtriptan (IMITREX) 25 MG tablet Take  1 tablet (25 mg total) by mouth daily. May take one more tablet two hours after the first. No more than two tablets per day. 02/07/17   Clent Demark, PA-C  topiramate (TOPAMAX) 25 MG tablet Take 1 tablet (25 mg total) by mouth daily. 12/07/16   Clent Demark, PA-C    Family History Family History  Problem Relation Age of Onset  . Stroke Father   . Anemia Daughter     Social History Social History   Tobacco Use  . Smoking status: Never Smoker  . Smokeless tobacco: Never Used  Substance Use Topics  . Alcohol use: No  . Drug use: No       Allergies   Patient has no known allergies.   Review of Systems Review of Systems  Constitutional: Positive for fatigue. Negative for chills and fever.  HENT: Negative for congestion, rhinorrhea and sore throat.   Eyes: Negative for visual disturbance.  Respiratory: Positive for cough. Negative for shortness of breath and wheezing.   Cardiovascular: Negative for chest pain and leg swelling.  Gastrointestinal: Negative for abdominal pain, diarrhea, nausea and vomiting.  Genitourinary: Negative for dysuria, flank pain, vaginal bleeding and vaginal discharge.  Musculoskeletal: Negative for neck pain.  Skin: Negative for rash.  Allergic/Immunologic: Negative for immunocompromised state.  Neurological: Positive for weakness. Negative for syncope and headaches.  Hematological: Bruises/bleeds easily.  All other systems reviewed and are negative.    Physical Exam Updated Vital Signs BP 126/84   Pulse 70   Temp 98.2 F (36.8 C) (Oral)   Resp 17   Ht _0  (1.626 m)   Wt 53.1 kg (117 lb)   SpO2 100%   BMI 20.08 kg/m   Physical Exam  Constitutional: She is oriented to person, place, and time. She appears well-developed and well-nourished. No distress.  HENT:  Head: Normocephalic.  Superficial tongue abrasion to left anterior tongue.  There is spontaneous bleeding noted from this wound as well as gum/gingiva.  Oropharynx without signs of posterior bleeding.  Eyes: Conjunctivae are normal.  Neck: Neck supple.  Cardiovascular: Normal rate, regular rhythm and normal heart sounds. Exam reveals no friction rub.  No murmur heard. Pulmonary/Chest: Effort normal and breath sounds normal. No respiratory distress. She has no wheezes. She has no rales.  Abdominal: She exhibits no distension.  Musculoskeletal: She exhibits no edema.  Neurological: She is alert and oriented to person, place, and time. She exhibits normal muscle tone.  Skin: Skin is warm. Capillary refill takes less  than 2 seconds.  Psychiatric: She has a normal mood and affect.  Nursing note and vitals reviewed.    ED Treatments / Results  Labs (all labs ordered are listed, but only abnormal results are displayed) Labs Reviewed  CBC WITH DIFFERENTIAL/PLATELET - Abnormal; Notable for the following components:      Result Value   RBC 2.97 (*)    Hemoglobin 8.7 (*)    HCT 26.0 (*)    Platelets <5 (*)    Lymphs Abs 0.3 (*)    Monocytes Absolute 4.0 (*)    All other components within normal limits  COMPREHENSIVE METABOLIC PANEL - Abnormal; Notable for the following components:   Sodium 153 (*)    Potassium 2.5 (*)    Glucose, Bld 131 (*)    Calcium 8.4 (*)    Total Protein 5.9 (*)    Albumin 2.6 (*)    ALT 9 (*)    All other components within normal limits  PROTIME-INR - Abnormal; Notable for the following components:   Prothrombin Time 16.6 (*)    All other components within normal limits  MAGNESIUM - Abnormal; Notable for the following components:   Magnesium 1.5 (*)    All other components within normal limits  I-STAT CHEM 8, ED - Abnormal; Notable for the following components:   Sodium 151 (*)    Potassium 2.4 (*)    Glucose, Bld 123 (*)    Calcium, Ion 1.11 (*)    Hemoglobin 8.2 (*)    HCT 24.0 (*)    All other components within normal limits  CULTURE, BLOOD (ROUTINE X 2)  CULTURE, BLOOD (ROUTINE X 2)  LIPASE, BLOOD  PATHOLOGIST SMEAR REVIEW  I-STAT CG4 LACTIC ACID, ED  I-STAT BETA HCG BLOOD, ED ([facility], [facility], [facility] ONLY)  TYPE AND SCREEN  PREPARE PLATELET PHERESIS    EKG  EKG Interpretation None       Radiology Dg Chest 2 View  Result Date: 05/11/2017 CLINICAL DATA:  Chest pain, shortness of breath, and recent pneumonia. History of leukemia. EXAM: CHEST - 2 VIEW COMPARISON:  11/21/2016 FINDINGS: The right-sided Port-A-Cath has been removed. A left PICC has been placed and terminates near the cavoatrial junction. The cardiac silhouette remains enlarged. There is mild  pulmonary vascular congestion which has decreased from prior. Abnormal confluent opacity is present in the retrocardiac left lower lobe. A small left pleural effusion is suspected. No pneumothorax is identified. There is thoracic dextroscoliosis. IMPRESSION: 1. Left lower lobe consolidation suspicious for pneumonia. 2. Cardiomegaly 3. Suspected small left pleural effusion with mild pulmonary vascular congestion, decreased from prior. Electronically Signed   By: Logan Bores M.D.   On: 05/11/2017 13:16    Procedures .Critical Care Performed by: Duffy Bruce, MD Authorized by: Duffy Bruce, MD   Critical care provider statement:    Critical care time (minutes):  75   Critical care time was exclusive of:  Separately billable procedures and treating other patients and teaching time   Critical care was necessary to treat or prevent imminent or life-threatening deterioration of the following conditions:  Cardiac failure, circulatory failure, dehydration and sepsis   Critical care was time spent personally by me on the following activities:  Development of treatment plan with patient or surrogate, discussions with consultants, evaluation of patient's response to treatment, examination of patient, obtaining history from patient or surrogate, ordering and performing treatments and interventions, ordering and review of laboratory studies, ordering and review of radiographic studies, pulse oximetry, re-evaluation of patient's condition and review of old charts   I assumed direction of critical care for this patient from another provider in my specialty: no     (including critical care time)  Medications Ordered in ED Medications  potassium chloride 10 mEq in 100 mL IVPB (10 mEq Intravenous New Bag/Given 05/11/17 1302)  lactated ringers bolus 1,000 mL (has no administration in time range)  magnesium sulfate IVPB 2 g 50 mL (has no administration in time range)  0.9 %  sodium chloride infusion (  Intravenous New Bag/Given 05/11/17 1224)  magic mouthwash (10 mLs Oral Given 05/11/17 1227)  pantoprazole (PROTONIX) 80 mg in sodium chloride 0.9 % 100 mL IVPB (0 mg Intravenous Stopped 05/11/17 1303)  fentaNYL (SUBLIMAZE) injection 50 mcg (50 mcg Intravenous Given 05/11/17 1221)  ondansetron (ZOFRAN) injection 4 mg (4 mg Intravenous Given 05/11/17 1221)     Initial Impression / Assessment and Plan / ED Course  I have reviewed the triage vital signs  and the nursing notes.  Pertinent labs & imaging results that were available during my care of the patient were reviewed by me and considered in my medical decision making (see chart for details).  Clinical Course as of May 11 1352  Thu May 11, 5629  4932 52 year old female with recurrent Amao here with nausea, vomiting, and general fatigue now spitting up blood.  On exam, the patient has multiple tongue lacerations, which could be etiology of her bleeding, the most also consider Mallory-Weiss tear or GI bleed in the setting of frequent vomiting.  Must also consider spontaneous bleeding in the setting of her AML and possible thrombocytopenia.  Will check stat labs, send type and screen, and reassess.  Will also give Magic mouthwash for her mouth pain and see if this improves her pain and ability to talk.  Otherwise, will give fluids, repeat chest x-ray.  Will likely need admission.   [CI]  1348 Replaced IV, with IV magnesium  Potassium(!!): 2.4 [CI]  1348 Likely etiology of spontaneous bleeding.  2 units transfused.  Platelets(!!): <5 [CI]  1348 Replaced  Magnesium(!): 1.5 [CI]  1348 DG Chest 2 View [CI]    Clinical Course User Index [CI] Duffy Bruce, MD    52 year old female here with spontaneous oral bleeding and generalized fatigue.  Patient with platelets less than 5.  Will transfuse 2 units and transferred to [facility] for admission.  Patient also markedly hyponatremic and hypokalemic, which I suspect is due to her nausea and  vomiting.  This will be replaced.  Patient also with hypomagnesemia which will be replaced.  Of note, chest x-ray is concerning for pneumonia.  She was just admitted at [facility] with similar symptoms.  She was started on antifungals and antibiotics.  Discussed with Dr. Joan Mayans - will admit to [facility]. She wants to hold on ABX at this time. Patient remains stable clinically.  ADDENDUM: Blood bank has no platelets available at this time that cross with pt. She requires HLA-matched. Her bleeding is controlled here. I've asked blood bank to notify the [facility] bank to have platelets ready, and will given platelets here (pooled) if she is still here by the time I'm able to get them to the bedside. RN will let [facility] know.  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

## 2017-05-11 NOTE — ED Notes (Signed)
RN notified of abnormal lab 

## 2017-05-11 NOTE — ED Triage Notes (Signed)
Per EMS, patient comes from home. Stage 4 leukemia. Last treatment one week ago today. N/v since with little PO intake. Also c/o spitting up blood. Won't move her mouth to talk. Stroke screen negative. tongue appears as if she bit off pieces of it. Alert, oriented, walks with assistance.

## 2017-05-11 NOTE — ED Notes (Signed)
ED TO INPATIENT HANDOFF REPORT  Name/Age/Gender Morgan Waters 52 y.o. female  Code Status Code Status History    Date Active Date Inactive Code Status Order ID Comments User Context   11/19/2016 1807 11/23/2016 1718 Full Code [id number]  Benito Mccreedy, MD ED      Home/SNF/Other [facility]  Chief Complaint [id number]  Level of Care/Admitting Diagnosis ED Disposition    ED Disposition Condition Comment   Transfer to Another Facility  The patient appears reasonably stabilized for transfer considering the current resources, flow, and capabilities available in the ED at this time, and I doubt any other [facility] requiring further screening and/or treatment in the ED prior to transfer is p resent.       Medical History Past Medical History:  Diagnosis Date  . Leukemia ([place])   . Migraine     Allergies No Known Allergies  IV Location/Drains/Wounds Patient Lines/Drains/Airways Status   Active Line/Drains/Airways    Name:   Placement date:   Placement time:   Site:   Days:   PICC Double Lumen 03/08/17 Left   03/08/17    -     64          Labs/Imaging Results for orders placed or performed during the hospital encounter of 05/11/17 (from the past 48 hour(s))  CBC with Differential     Status: Abnormal   Collection Time: 05/11/17 12:00 PM  Result Value Ref Range   WBC 6.3 4.0 - 10.5 K/uL   RBC 2.97 (L) 3.87 - 5.11 MIL/uL   Hemoglobin 8.7 (L) 12.0 - 15.0 g/dL   HCT 26.0 (L) 36.0 - 46.0 %   MCV 87.5 78.0 - 100.0 fL   MCH 29.3 26.0 - 34.0 pg   MCHC 33.5 30.0 - 36.0 g/dL   RDW 14.0 11.5 - 15.5 %   Platelets <5 (LL) 150 - 400 K/uL    Comment: CRITICAL RESULT CALLED TO, READ BACK BY AND VERIFIED WITH: Shakirra Buehler,C. RN _0  ON 3.21.19 BY NMCCOY REPEATED TO VERIFY SPECIMEN CHECKED FOR CLOTS PLATELET COUNT CONFIRMED BY SMEAR CORRECTED ON 03/21 AT 1331: PREVIOUSLY REPORTED AS <5 CRITICAL RESULT CALLED TO, READ BACK BY AND VERIFIED WITH: Makinzey Banes,C. RN _1  ON 3.21.19 BY NMCCOY    Neutrophils Relative % 32 %   Lymphocytes Relative 5 %   Monocytes Relative 63 %   Eosinophils Relative 0 %   Basophils Relative 0 %   Other 0 %   Neutro Abs 2.0 1.7 - 7.7 K/uL   Lymphs Abs 0.3 (L) 0.7 - 4.0 K/uL   Monocytes Absolute 4.0 (H) 0.1 - 1.0 K/uL   Eosinophils Absolute 0.0 0.0 - 0.7 K/uL   Basophils Absolute 0.0 0.0 - 0.1 K/uL   WBC Morphology WHITE COUNT CONFIRMED ON SMEAR     Comment: Performed at [facility], [place] [address] [place], [place] [address]  Comprehensive metabolic panel     Status: Abnormal   Collection Time: 05/11/17 12:00 PM  Result Value Ref Range   Sodium 153 (H) 135 - 145 mmol/L   Potassium 2.5 (LL) 3.5 - 5.1 mmol/L    Comment: CRITICAL RESULT CALLED TO, READ BACK BY AND VERIFIED WITH: WEST,S. RN AT 1306 05/11/17 MULLINS,T    Chloride 107 101 - 111 mmol/L   CO2 31 22 - 32 mmol/L   Glucose, Bld 131 (H) 65 - 99 mg/dL   BUN 18 6 - 20 mg/dL   Creatinine, Ser 0.63 0.44 - 1.00 mg/dL   Calcium 8.4 (  L) 8.9 - 10.3 mg/dL   Total Protein 5.9 (L) 6.5 - 8.1 g/dL   Albumin 2.6 (L) 3.5 - 5.0 g/dL   AST 22 15 - 41 U/L   ALT 9 (L) 14 - 54 U/L   Alkaline Phosphatase 54 38 - 126 U/L   Total Bilirubin 1.2 0.3 - 1.2 mg/dL   GFR calc non Af Amer >60 >60 mL/min   GFR calc Af Amer >60 >60 mL/min    Comment: (NOTE) The eGFR has been calculated using the CKD EPI equation. This calculation has not been validated in all clinical situations. eGFR's persistently <60 mL/min signify possible Chronic Kidney Disease.    Anion gap 15 5 - 15    Comment: Performed at [facility], [place] [address] [place], [place] [address]  Lipase, blood     Status: None   Collection Time: 05/11/17 12:00 PM  Result Value Ref Range   Lipase 16 11 - 51 U/L    Comment: Performed at [facility], [place] [address] [place], [place] [address]  Protime-INR     Status: Abnormal   Collection Time: 05/11/17 12:00 PM  Result Value Ref Range    Prothrombin Time 16.6 (H) 11.4 - 15.2 seconds   INR 1.36     Comment: Performed at [facility], [place] [address], [place], [place] [address]  Type and screen [place]     Status: None   Collection Time: 05/11/17 12:00 PM  Result Value Ref Range   ABO/RH(D) O POS    Antibody Screen NEG    Sample Expiration      05/14/2017 Performed at [facility], [place] [address], [place], [place] [address]   Magnesium     Status: Abnormal   Collection Time: 05/11/17 12:00 PM  Result Value Ref Range   Magnesium 1.5 (L) 1.7 - 2.4 mg/dL    Comment: Performed at [facility], [place] [address] [place], [place] [address]  I-Stat Beta hCG blood, ED ([facility], [facility], [facility] only)     Status: None   Collection Time: 05/11/17 12:21 PM  Result Value Ref Range   I-stat hCG, quantitative <5.0 <5 mIU/mL   Comment 3            Comment:   GEST. AGE      CONC.  (mIU/mL)   <=1 WEEK        5 - 50     2 WEEKS       50 - 500     3 WEEKS       100 - 10,000     4 WEEKS     1,000 - 30,000        FEMALE AND NON-PREGNANT FEMALE:     LESS THAN 5 mIU/mL   I-Stat CG4 Lactic Acid, ED     Status: None   Collection Time: 05/11/17 12:22 PM  Result Value Ref Range   Lactic Acid, Venous 1.16 0.5 - 1.9 mmol/L  I-Stat Chem 8, ED     Status: Abnormal   Collection Time: 05/11/17 12:23 PM  Result Value Ref Range   Sodium 151 (H) 135 - 145 mmol/L   Potassium 2.4 (LL) 3.5 - 5.1 mmol/L   Chloride 104 101 - 111 mmol/L   BUN 16 6 - 20 mg/dL   Creatinine, Ser 0.50 0.44 - 1.00 mg/dL   Glucose, Bld 123 (H) 65 - 99 mg/dL   Calcium, Ion  1.11 (L) 1.15 - 1.40 mmol/L   TCO2 31 22 - 32 mmol/L   Hemoglobin 8.2 (L) 12.0 - 15.0 g/dL   HCT 24.0 (L) 36.0 - 46.0 %   Comment NOTIFIED PHYSICIAN    Dg Chest 2 View  Result Date: 05/11/2017 CLINICAL DATA:  Chest pain, shortness of breath, and recent pneumonia. History of leukemia. EXAM: CHEST - 2 VIEW COMPARISON:  11/21/2016 FINDINGS:  The right-sided Port-A-Cath has been removed. A left PICC has been placed and terminates near the cavoatrial junction. The cardiac silhouette remains enlarged. There is mild pulmonary vascular congestion which has decreased from prior. Abnormal confluent opacity is present in the retrocardiac left lower lobe. A small left pleural effusion is suspected. No pneumothorax is identified. There is thoracic dextroscoliosis. IMPRESSION: 1. Left lower lobe consolidation suspicious for pneumonia. 2. Cardiomegaly 3. Suspected small left pleural effusion with mild pulmonary vascular congestion, decreased from prior. Electronically Signed   By: Logan Bores M.D.   On: 05/11/2017 13:16    Pending Labs Unresulted Labs (From admission, onward)   Start     Ordered   05/11/17 1344  Blood culture (routine x 2)  BLOOD CULTURE X 2,   STAT     05/11/17 1344   05/11/17 1310  Prepare Pheresed Platelets  (Adult Blood Administration - Platelets (Pheresed))  Once,   R    Question Answer Comment  Number of Apheresis Units 2 units (12-20 packs)   Transfusion Indications Platelet Dysfunction   Transfusion Indications Actively Bleeding   If emergent release call blood bank Not emergent release      05/11/17 1310   05/11/17 1200  Pathologist smear review  Once,   R     05/11/17 1200      Vitals/Pain Today's Vitals   05/11/17 1227 05/11/17 1302 05/11/17 1324 05/11/17 1400  BP: 137/90  126/84 126/89  Pulse: 82  70 94  Resp: 16  17 (!) 28  Temp:      TempSrc:      SpO2: 95%  100% 100%  Weight:      Height:      PainSc:  2       Isolation Precautions No active isolations  Medications Medications  potassium chloride 10 mEq in 100 mL IVPB (10 mEq Intravenous New Bag/Given 05/11/17 1404)  lactated ringers bolus 1,000 mL (1,000 mLs Intravenous New Bag/Given 05/11/17 1351)  magnesium sulfate IVPB 2 g 50 mL (2 g Intravenous New Bag/Given 05/11/17 1353)  0.9 %  sodium chloride infusion ( Intravenous Stopped 05/11/17  1406)  magic mouthwash (10 mLs Oral Given 05/11/17 1227)  pantoprazole (PROTONIX) 80 mg in sodium chloride 0.9 % 100 mL IVPB (0 mg Intravenous Stopped 05/11/17 1303)  fentaNYL (SUBLIMAZE) injection 50 mcg (50 mcg Intravenous Given 05/11/17 1221)  ondansetron (ZOFRAN) injection 4 mg (4 mg Intravenous Given 05/11/17 1221)    Mobility walks

## 2017-05-15 ENCOUNTER — Other Ambulatory Visit: Payer: Medicaid Other

## 2017-05-15 MED ORDER — DEXTROSE-NACL 5-0.2 % IV SOLN
INTRAVENOUS | Status: DC
Start: ? — End: 2017-05-15

## 2017-05-15 MED ORDER — GENERIC EXTERNAL MEDICATION
2.00 g | Status: DC
Start: 2017-05-15 — End: 2017-05-15

## 2017-05-15 MED ORDER — GENERIC EXTERNAL MEDICATION
3.38 g | Status: DC
Start: 2017-05-15 — End: 2017-05-15

## 2017-05-15 MED ORDER — GENERIC EXTERNAL MEDICATION
200.00 | Status: DC
Start: 2017-05-15 — End: 2017-05-15

## 2017-05-15 MED ORDER — LORAZEPAM 0.5 MG PO TABS
.50 | ORAL_TABLET | ORAL | Status: DC
Start: ? — End: 2017-05-15

## 2017-05-15 MED ORDER — OXYCODONE HCL 5 MG/5ML PO SOLN
5.00 | ORAL | Status: DC
Start: ? — End: 2017-05-15

## 2017-05-15 MED ORDER — LIDOCAINE 5 % EX PTCH
1.00 | MEDICATED_PATCH | CUTANEOUS | Status: DC
Start: 2017-05-15 — End: 2017-05-15

## 2017-05-15 MED ORDER — VANCOMYCIN HCL IN DEXTROSE 750-5 MG/150ML-% IV SOLN
750.00 | INTRAVENOUS | Status: DC
Start: 2017-05-15 — End: 2017-05-15

## 2017-05-15 MED ORDER — GENERIC EXTERNAL MEDICATION
30.00 | Status: DC
Start: 2017-05-15 — End: 2017-05-15

## 2017-05-15 MED ORDER — GENERIC EXTERNAL MEDICATION
5.00 | Status: DC
Start: 2017-05-15 — End: 2017-05-15

## 2017-05-15 MED ORDER — LIDOCAINE 5 % EX PTCH
1.00 | MEDICATED_PATCH | CUTANEOUS | Status: DC
Start: 2017-05-16 — End: 2017-05-15

## 2017-05-15 MED ORDER — GENERIC EXTERNAL MEDICATION
625.00 | Status: DC
Start: 2017-05-15 — End: 2017-05-15

## 2017-05-16 ENCOUNTER — Telehealth: Payer: Self-pay | Admitting: *Deleted

## 2017-05-16 NOTE — Telephone Encounter (Signed)
Received call from [facility] RN/[facility], inpt coord informing that pt is in the hospital as of 05/11/17 & not doing well.  She suggested cancelling 05/18/17 appts but will keep [place] updated & her direct line is [phone].  Message to Dr Feng/Pod RN

## 2017-05-18 ENCOUNTER — Other Ambulatory Visit: Payer: Medicaid Other

## 2017-05-22 ENCOUNTER — Other Ambulatory Visit: Payer: Medicaid Other

## 2017-05-23 ENCOUNTER — Telehealth: Payer: Self-pay | Admitting: *Deleted

## 2017-05-23 NOTE — Telephone Encounter (Signed)
Jenn, new pt referral for [facility] left message wanting to know if Dr. Burr Medico will be hospice attending for pt.   Spoke with [place], and was informed that pt will be discharged home today from [place] , and a referral to Hospice.   Informed Jenn that Dr. Burr Medico will be hospice attending, hospice providers to assist with symptom management, and activate hospice standing orders as needed.  Jenn voiced understanding. Jenn informed nurse that pt's son had contacted her with information that pt might not make it home.

## 2017-05-26 ENCOUNTER — Telehealth: Payer: Self-pay | Admitting: Medical Oncology

## 2017-05-26 NOTE — Telephone Encounter (Signed)
Pt died 05/25/18 at 0946pm

## 2017-06-21 DEATH — deceased

## 2017-06-28 ENCOUNTER — Telehealth: Payer: Self-pay

## 2017-06-28 NOTE — Telephone Encounter (Signed)
Faxed information to Hospice as requested.

## 2018-10-24 IMAGING — DX DG CHEST 1V PORT
1 series · 1 of 1 positions shown · non-contrast
Comparison: None.

CLINICAL DATA: Nausea, vomiting, diarrhea. Leukemia with active
chemotherapy.

EXAM:
PORTABLE CHEST 1 VIEW

[chest ap]
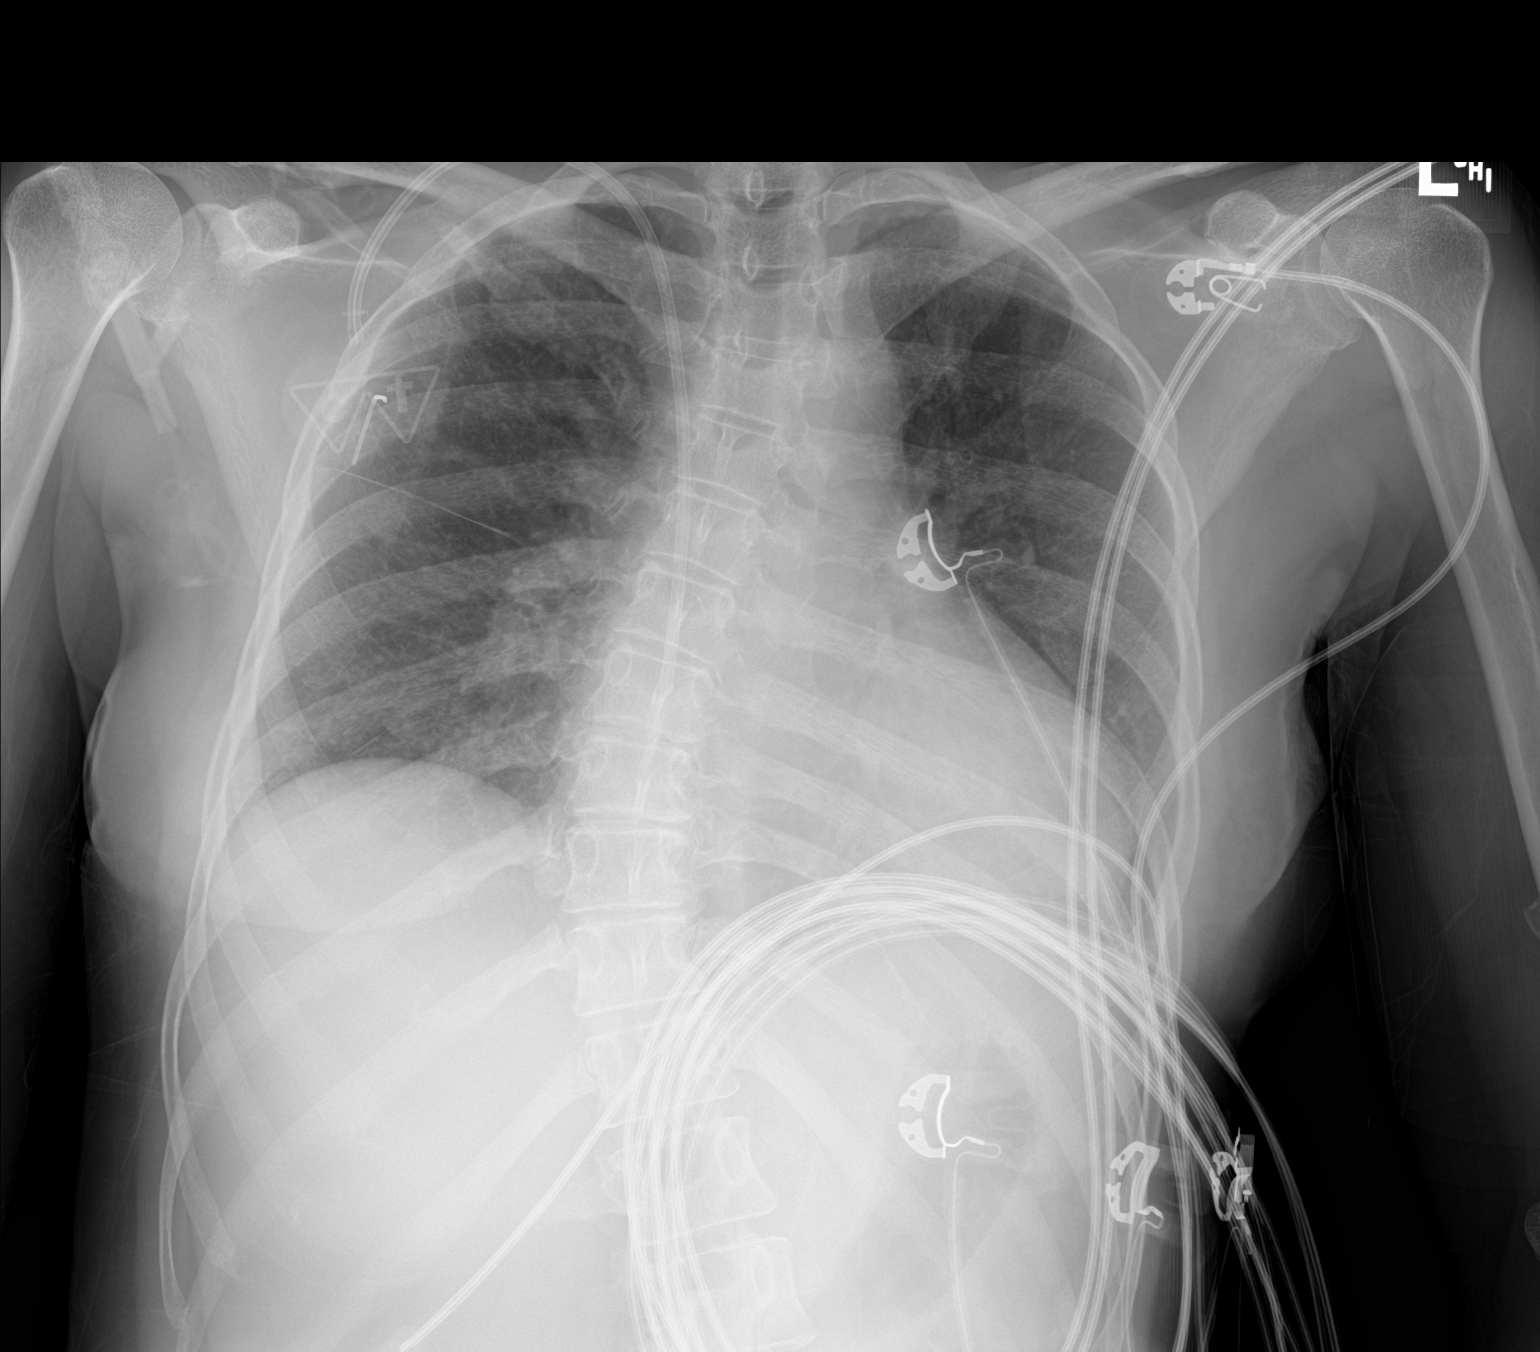

[1 of 1 positions shown; findings below may reference images not displayed]

FINDINGS: Tip of the right chest port in the distal SVC. Lung volumes are low.
Borderline cardiomegaly likely accentuated by technique. No
consolidation, pulmonary edema or large pleural effusion, left
hemidiaphragm obscured by overlying monitoring devices. No
pneumothorax. Scoliotic curvature of the spine.
IMPRESSION: Low lung volumes with borderline cardiomegaly.

## 2020-10-17 NOTE — Telephone Encounter (Signed)
This encounter was created in error - please disregard.
